# Patient Record
Sex: Male | Born: 1990 | Race: Black or African American | Hispanic: No | Marital: Single | Smoking: Never smoker
Health system: Southern US, Community
[De-identification: ages and names within clinical notes are randomized; demographics above are authoritative.]

## PROBLEM LIST (undated history)

## (undated) DIAGNOSIS — F845 Asperger's syndrome: Secondary | ICD-10-CM

---

## 2019-07-18 ENCOUNTER — Encounter (HOSPITAL_COMMUNITY): Payer: Self-pay | Admitting: Emergency Medicine

## 2019-07-18 ENCOUNTER — Emergency Department (HOSPITAL_COMMUNITY): Payer: Self-pay

## 2019-07-18 ENCOUNTER — Emergency Department (HOSPITAL_COMMUNITY)
Admission: EM | Admit: 2019-07-18 | Discharge: 2019-07-18 | Disposition: A | Discharge status: Home / Self Care | Payer: No Typology Code available for payment source | Attending: Emergency Medicine | Admitting: Emergency Medicine

## 2019-07-18 ENCOUNTER — Other Ambulatory Visit: Payer: Self-pay

## 2019-07-18 DIAGNOSIS — M79671 Pain in right foot: Secondary | ICD-10-CM | POA: Insufficient documentation

## 2019-07-18 DIAGNOSIS — Y990 Civilian activity done for income or pay: Secondary | ICD-10-CM | POA: Insufficient documentation

## 2019-07-18 DIAGNOSIS — Y9389 Activity, other specified: Secondary | ICD-10-CM | POA: Diagnosis not present

## 2019-07-18 DIAGNOSIS — Y92481 Parking lot as the place of occurrence of the external cause: Secondary | ICD-10-CM | POA: Insufficient documentation
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
## 2019-07-18 NOTE — Discharge Instructions (Addendum)

## 2019-07-18 NOTE — ED Notes (Signed)
An After Visit Summary was printed and given to the patient. Discharge instructions given and no further questions at this time.  

## 2019-07-18 NOTE — Progress Notes (Signed)
Orthopedic Tech Progress Note Patient Details:  Juan Hendrix 19-May-1991 [id number]  Ortho Devices Type of Ortho Device: Ace wrap, Crutches Ortho Device/Splint Location: RLE Ortho Device/Splint Interventions: Ordered, Application, Adjustment   Post Interventions Patient Tolerated: Well Instructions Provided: Care of device, Adjustment of device, Poper ambulation with device   Ancil Linsey 07/18/2019, 8:26 PM
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
## 2020-09-13 ENCOUNTER — Observation Stay (HOSPITAL_COMMUNITY): Payer: Self-pay

## 2020-09-13 ENCOUNTER — Encounter (HOSPITAL_COMMUNITY): Payer: Self-pay | Admitting: Emergency Medicine

## 2020-09-13 ENCOUNTER — Other Ambulatory Visit: Payer: Self-pay

## 2020-09-13 ENCOUNTER — Emergency Department (HOSPITAL_COMMUNITY): Payer: Self-pay

## 2020-09-13 ENCOUNTER — Observation Stay (HOSPITAL_COMMUNITY)
Admission: EM | Admit: 2020-09-13 | Discharge: 2020-09-14 | Disposition: A | Discharge status: Home / Self Care | Payer: Self-pay | Attending: Family Medicine | Admitting: Family Medicine

## 2020-09-13 ENCOUNTER — Observation Stay (HOSPITAL_COMMUNITY)
Admit: 2020-09-13 | Discharge: 2020-09-13 | Disposition: A | Discharge status: Home / Self Care | Payer: Self-pay | Attending: Internal Medicine | Admitting: Internal Medicine

## 2020-09-13 DIAGNOSIS — R778 Other specified abnormalities of plasma proteins: Secondary | ICD-10-CM

## 2020-09-13 DIAGNOSIS — R55 Syncope and collapse: Principal | ICD-10-CM | POA: Diagnosis present

## 2020-09-13 DIAGNOSIS — I77819 Aortic ectasia, unspecified site: Secondary | ICD-10-CM | POA: Insufficient documentation

## 2020-09-13 DIAGNOSIS — D509 Iron deficiency anemia, unspecified: Secondary | ICD-10-CM | POA: Insufficient documentation

## 2020-09-13 DIAGNOSIS — I160 Hypertensive urgency: Secondary | ICD-10-CM

## 2020-09-13 DIAGNOSIS — R030 Elevated blood-pressure reading, without diagnosis of hypertension: Secondary | ICD-10-CM | POA: Insufficient documentation

## 2020-09-13 DIAGNOSIS — Z20822 Contact with and (suspected) exposure to covid-19: Secondary | ICD-10-CM | POA: Insufficient documentation

## 2020-09-13 DIAGNOSIS — R9431 Abnormal electrocardiogram [ECG] [EKG]: Secondary | ICD-10-CM

## 2020-09-13 DIAGNOSIS — R7989 Other specified abnormal findings of blood chemistry: Secondary | ICD-10-CM

## 2020-09-13 DIAGNOSIS — R7401 Elevation of levels of liver transaminase levels: Secondary | ICD-10-CM | POA: Insufficient documentation

## 2020-09-13 HISTORY — DX: Asperger's syndrome: F84.5

## 2020-09-13 LAB — HEPATITIS PANEL, ACUTE
HCV Ab: NONREACTIVE
Hep A IgM: NONREACTIVE
Hep B C IgM: NONREACTIVE
Hepatitis B Surface Ag: NONREACTIVE

## 2020-09-13 LAB — CBC
HCT: 45.5 % (ref 39.0–52.0)
Hemoglobin: 14.6 g/dL (ref 13.0–17.0)
MCH: 25.2 pg — ABNORMAL LOW (ref 26.0–34.0)
MCHC: 32.1 g/dL (ref 30.0–36.0)
MCV: 78.6 fL — ABNORMAL LOW (ref 80.0–100.0)
Platelets: 188 10*3/uL (ref 150–400)
RBC: 5.79 MIL/uL (ref 4.22–5.81)
RDW: 14.3 % (ref 11.5–15.5)
WBC: 7.4 10*3/uL (ref 4.0–10.5)
nRBC: 0 % (ref 0.0–0.2)

## 2020-09-13 LAB — RESP PANEL BY RT-PCR (FLU A&B, COVID) ARPGX2
Influenza A by PCR: NEGATIVE
Influenza B by PCR: NEGATIVE
SARS Coronavirus 2 by RT PCR: NEGATIVE

## 2020-09-13 LAB — URINALYSIS, ROUTINE W REFLEX MICROSCOPIC
Bacteria, UA: NONE SEEN
Bilirubin Urine: NEGATIVE
Glucose, UA: NEGATIVE mg/dL
Ketones, ur: NEGATIVE mg/dL
Leukocytes,Ua: NEGATIVE
Nitrite: NEGATIVE
Protein, ur: 30 mg/dL — AB
Specific Gravity, Urine: 1.008 (ref 1.005–1.030)
pH: 8 (ref 5.0–8.0)

## 2020-09-13 LAB — COMPREHENSIVE METABOLIC PANEL
ALT: 70 U/L — ABNORMAL HIGH (ref 0–44)
AST: 60 U/L — ABNORMAL HIGH (ref 15–41)
Albumin: 4.3 g/dL (ref 3.5–5.0)
Alkaline Phosphatase: 56 U/L (ref 38–126)
Anion gap: 7 (ref 5–15)
BUN: 16 mg/dL (ref 6–20)
CO2: 28 mmol/L (ref 22–32)
Calcium: 9 mg/dL (ref 8.9–10.3)
Chloride: 104 mmol/L (ref 98–111)
Creatinine, Ser: 0.99 mg/dL (ref 0.61–1.24)
GFR, Estimated: >60 mL/min (ref >60)
Glucose, Bld: 113 mg/dL — ABNORMAL HIGH (ref 70–99)
Potassium: 3.6 mmol/L (ref 3.5–5.1)
Sodium: 139 mmol/L (ref 135–145)
Total Bilirubin: 0.6 mg/dL (ref 0.3–1.2)
Total Protein: 8.2 g/dL — ABNORMAL HIGH (ref 6.5–8.1)

## 2020-09-13 LAB — RAPID URINE DRUG SCREEN, HOSP PERFORMED
Amphetamines: NOT DETECTED
Barbiturates: NOT DETECTED
Benzodiazepines: NOT DETECTED
Cocaine: NOT DETECTED
Opiates: NOT DETECTED
Tetrahydrocannabinol: NOT DETECTED

## 2020-09-13 LAB — CBC WITH DIFFERENTIAL/PLATELET
Abs Immature Granulocytes: 0.02 10*3/uL (ref 0.00–0.07)
Basophils Absolute: 0 10*3/uL (ref 0.0–0.1)
Basophils Relative: 1 %
Eosinophils Absolute: 0.1 10*3/uL (ref 0.0–0.5)
Eosinophils Relative: 1 %
HCT: 48.3 % (ref 39.0–52.0)
Hemoglobin: 15.1 g/dL (ref 13.0–17.0)
Immature Granulocytes: 0 %
Lymphocytes Relative: 17 %
Lymphs Abs: 1.4 10*3/uL (ref 0.7–4.0)
MCH: 24.9 pg — ABNORMAL LOW (ref 26.0–34.0)
MCHC: 31.3 g/dL (ref 30.0–36.0)
MCV: 79.7 fL — ABNORMAL LOW (ref 80.0–100.0)
Monocytes Absolute: 0.6 10*3/uL (ref 0.1–1.0)
Monocytes Relative: 8 %
Neutro Abs: 6 10*3/uL (ref 1.7–7.7)
Neutrophils Relative %: 73 %
Platelets: 190 10*3/uL (ref 150–400)
RBC: 6.06 MIL/uL — ABNORMAL HIGH (ref 4.22–5.81)
RDW: 14.6 % (ref 11.5–15.5)
WBC: 8.1 10*3/uL (ref 4.0–10.5)
nRBC: 0 % (ref 0.0–0.2)

## 2020-09-13 LAB — IRON AND TIBC
Iron: 43 ug/dL — ABNORMAL LOW (ref 45–182)
Saturation Ratios: 12 % — ABNORMAL LOW (ref 17.9–39.5)
TIBC: 357 ug/dL (ref 250–450)
UIBC: 314 ug/dL

## 2020-09-13 LAB — CREATININE, SERUM
Creatinine, Ser: 0.94 mg/dL (ref 0.61–1.24)
GFR, Estimated: >60 mL/min (ref >60)

## 2020-09-13 LAB — TROPONIN I (HIGH SENSITIVITY)
Troponin I (High Sensitivity): 169 ng/L (ref <18)
Troponin I (High Sensitivity): 167 ng/L (ref <18)
Troponin I (High Sensitivity): 92 ng/L — ABNORMAL HIGH (ref <18)
Troponin I (High Sensitivity): 32 ng/L — ABNORMAL HIGH (ref <18)

## 2020-09-13 LAB — HIV ANTIBODY (ROUTINE TESTING W REFLEX): HIV Screen 4th Generation wRfx: NONREACTIVE

## 2020-09-13 LAB — ETHANOL: Alcohol, Ethyl (B): <10 mg/dL (ref <10)

## 2020-09-13 LAB — TSH: TSH: 3.541 u[IU]/mL (ref 0.350–4.500)

## 2020-09-13 MED ORDER — ENOXAPARIN SODIUM 40 MG/0.4ML ~~LOC~~ SOLN: 40.0000 mg | SUBCUTANEOUS | Status: DC

## 2020-09-13 MED ORDER — ENOXAPARIN SODIUM 60 MG/0.6ML ~~LOC~~ SOLN
60.0000 mg | SUBCUTANEOUS | Status: DC
  Administered 2020-09-13: 60 mg via SUBCUTANEOUS
  Filled 2020-09-13: qty 0.6

## 2020-09-13 MED ORDER — ACETAMINOPHEN 325 MG PO TABS
650.0000 mg | ORAL_TABLET | Freq: Once | ORAL | Status: AC
  Administered 2020-09-13: 650 mg via ORAL
  Filled 2020-09-13: qty 2

## 2020-09-13 MED ORDER — IOHEXOL 350 MG/ML SOLN
100.0000 mL | Freq: Once | INTRAVENOUS | Status: AC | PRN
  Administered 2020-09-13: 100 mL via INTRAVENOUS

## 2020-09-13 MED ORDER — HYDRALAZINE HCL 20 MG/ML IJ SOLN
5.0000 mg | Freq: Four times a day (QID) | INTRAMUSCULAR | Status: DC | PRN
  Administered 2020-09-13: 5 mg via INTRAVENOUS
  Filled 2020-09-13: qty 1

## 2020-09-13 MED ORDER — SODIUM CHLORIDE 0.9% FLUSH
3.0000 mL | Freq: Two times a day (BID) | INTRAVENOUS | Status: DC
  Administered 2020-09-13 – 2020-09-14 (×3): 3 mL via INTRAVENOUS

## 2020-09-13 MED ORDER — ONDANSETRON HCL 4 MG PO TABS: 4.0000 mg | ORAL_TABLET | Freq: Four times a day (QID) | ORAL | Status: DC | PRN

## 2020-09-13 MED ORDER — AMLODIPINE BESYLATE 5 MG PO TABS
5.0000 mg | ORAL_TABLET | Freq: Every day | ORAL | Status: DC
  Administered 2020-09-14: 5 mg via ORAL
  Filled 2020-09-13: qty 1

## 2020-09-13 MED ORDER — AMLODIPINE BESYLATE 5 MG PO TABS
10.0000 mg | ORAL_TABLET | Freq: Once | ORAL | Status: AC
  Administered 2020-09-13: 10 mg via ORAL
  Filled 2020-09-13: qty 2

## 2020-09-13 MED ORDER — ONDANSETRON HCL 4 MG/2ML IJ SOLN: 4.0000 mg | Freq: Four times a day (QID) | INTRAMUSCULAR | Status: DC | PRN

## 2020-09-13 MED ORDER — ACETAMINOPHEN 325 MG PO TABS
650.0000 mg | ORAL_TABLET | Freq: Four times a day (QID) | ORAL | Status: DC | PRN
  Administered 2020-09-13: 650 mg via ORAL
  Filled 2020-09-13: qty 2

## 2020-09-13 MED ORDER — LORAZEPAM 2 MG/ML IJ SOLN
0.5000 mg | INTRAMUSCULAR | Status: AC
  Administered 2020-09-13: 0.5 mg via INTRAVENOUS
  Filled 2020-09-13: qty 1

## 2020-09-13 NOTE — ED Provider Notes (Addendum)
[facility]-EMERGENCY DEPT Provider Note   CSN: [id number] Arrival date & time: 09/13/20  0302     History Chief Complaint  Patient presents with  . Loss of Consciousness    Juan Hendrix is a 30 y.o. male presenting for evaluation of syncope.  Patient states he was staying up late working on something.  He was getting ready for bed, and the next thing he knows he woke up in the bathroom.  He does not know how he got there what happened.  He reports a mild headache, but mostly states he is feeling anxious.  He denies current dizziness, chest pain, shortness of breath, neck pain, back pain, numbness, tingling.  He denies recent fevers, chills, cough, nausea, vomiting, abdominal pain, urinary symptoms, abnormal bowel movements.  He states he has no medical problems, takes no medications daily.  No history of similar events.  He denies sick contacts.  He denies tobacco, alcohol, or drug use.  Additional history obtained from patient's mom.  She woke up when she heard a thud in the bathroom.  A few minutes later, patient called out.  When she went to the bathroom, patient was laying on the floor, appeared very anxious and shaky.  He was little bit confused, but this gradually improved.  He did have bladder incontinence.  HPI     Past Medical History:  Diagnosis Date  . Asperger syndrome     There are no problems to display for this patient.   History reviewed. No pertinent surgical history.     History reviewed. No pertinent family history.  Social History   Tobacco Use  . Smoking status: Never Smoker  . Smokeless tobacco: Never Used  Vaping Use  . Vaping Use: Never used  Substance Use Topics  . Alcohol use: Never  . Drug use: Never    Home Medications Prior to Admission medications   Not on File    Allergies    Patient has no known allergies.  Review of Systems   Review of Systems  Neurological: Positive for headaches.   Psychiatric/Behavioral: Positive for confusion (resolved).  All other systems reviewed and are negative.   Physical Exam Updated Vital Signs BP 128/83   Pulse 72   Temp 98.4 F (36.9 C) (Oral)   Resp 18   Ht 6\' 7"  (2.007 m)   Wt 131.5 kg   SpO2 100%   BMI 32.67 kg/m   Physical Exam Vitals and nursing note reviewed.  Constitutional:      General: He is not in acute distress.    Appearance: He is well-developed.     Comments: Resting in the bed in NAD  HENT:     Head: Normocephalic.      Comments: Hematoma to posterior head Eyes:     Extraocular Movements: Extraocular movements intact.     Conjunctiva/sclera: Conjunctivae normal.     Pupils: Pupils are equal, round, and reactive to light.     Comments: EOMI and PERRLA  Neck:     Comments: No ttp of midline c-spine Cardiovascular:     Rate and Rhythm: Normal rate and regular rhythm.     Pulses: Normal pulses.  Pulmonary:     Effort: Pulmonary effort is normal. No respiratory distress.     Breath sounds: Normal breath sounds. No wheezing.  Abdominal:     General: There is no distension.     Palpations: Abdomen is soft. There is no mass.     Tenderness:  There is no abdominal tenderness. There is no guarding or rebound.  Musculoskeletal:        General: Normal range of motion.     Cervical back: Normal range of motion and neck supple.  Skin:    General: Skin is warm and dry.     Capillary Refill: Capillary refill takes less than 2 seconds.  Neurological:     Mental Status: He is alert and oriented to person, place, and time.     GCS: GCS eye subscore is 4. GCS verbal subscore is 5. GCS motor subscore is 6.     Cranial Nerves: Cranial nerves are intact.     Sensory: Sensation is intact.     Motor: Motor function is intact.     ED Results / Procedures / Treatments   Labs (all labs ordered are listed, but only abnormal results are displayed) Labs Reviewed  CBC WITH DIFFERENTIAL/PLATELET - Abnormal; Notable for  the following components:      Result Value   RBC 6.06 (*)    MCV 79.7 (*)    MCH 24.9 (*)    All other components within normal limits  COMPREHENSIVE METABOLIC PANEL - Abnormal; Notable for the following components:   Glucose, Bld 113 (*)    Total Protein 8.2 (*)    AST 60 (*)    ALT 70 (*)    All other components within normal limits  URINALYSIS, ROUTINE W REFLEX MICROSCOPIC - Abnormal; Notable for the following components:   Color, Urine STRAW (*)    Hgb urine dipstick MODERATE (*)    Protein, ur 30 (*)    All other components within normal limits  TROPONIN I (HIGH SENSITIVITY) - Abnormal; Notable for the following components:   Troponin I (High Sensitivity) 169 (*)    All other components within normal limits  RESP PANEL BY RT-PCR (FLU A&B, COVID) [id number]  RAPID URINE DRUG SCREEN, HOSP PERFORMED  ETHANOL  TROPONIN I (HIGH SENSITIVITY)    EKG EKG Interpretation  Date/Time:  Wednesday September 13 2020 06:47:27 EDT Ventricular Rate:  54 PR Interval:    QRS Duration: 100 QT Interval:  404 QTC Calculation: 383 R Axis:   15 Text Interpretation: Sinus rhythm Probable left ventricular hypertrophy Anterior ST elevation, probably due to LVH No significant change since last tracing Confirmed by Gilda Crease [phone]) on 09/13/2020 6:50:25 AM   Radiology DG Chest 2 View  Result Date: 09/13/2020 CLINICAL DATA:  Syncope EXAM: CHEST - 2 VIEW COMPARISON:  None. FINDINGS: The heart size and mediastinal contours are within normal limits. No focal consolidation. No pulmonary edema. No pleural effusion. No pneumothorax. No acute osseous abnormality. IMPRESSION: No active cardiopulmonary disease. Electronically Signed   By: Tish Frederickson M.D.   On: 09/13/2020 05:53   CT Head Wo Contrast  Result Date: 09/13/2020 CLINICAL DATA:  Syncopal episode, status post fall EXAM: CT HEAD WITHOUT CONTRAST TECHNIQUE: Contiguous axial images were obtained from the base of the skull through the  vertex without intravenous contrast. COMPARISON:  None. FINDINGS: Brain: No evidence of large-territorial acute infarction. No parenchymal hemorrhage. No mass lesion. No extra-axial collection. No mass effect or midline shift. No hydrocephalus. Basilar cisterns are patent. Vascular: No hyperdense vessel. Skull: No acute fracture or focal lesion. Sinuses/Orbits: Paranasal sinuses and mastoid air cells are clear. The orbits are unremarkable. Other: Trace subcutaneus soft tissue edema of the right posterior scalp. IMPRESSION: No acute intracranial abnormality. Electronically Signed   By: Normajean Glasgow.D.  On: 09/13/2020 05:26    Procedures Procedures   Medications Ordered in ED Medications  acetaminophen (TYLENOL) tablet 650 mg (650 mg Oral Given 09/13/20 5462)    ED Course  I have reviewed the triage vital signs and the nursing notes.  Pertinent labs & imaging results that were available during my care of the patient were reviewed by me and considered in my medical decision making (see chart for details).    MDM Rules/Calculators/A&P                          Pt presenting for evaluation of syncope vs sz. On exam, pt appears nontoxic. No neuro deficits. As pt had bladder incontinence, consider sz, but no visualized sz-like activity. No h/o sz. Also consider anemia causing syncope. Consider arrhythmia, electrolyte abnormality. Consider drugs/etoh. Will obtain labs, ct head, ua, ekg.   EKG with nonspecific st changes, no previous to compare.  Patient's troponin elevated at 169.  He is not having any chest pain.  No previous history of syncope, no family history of syncope at a young age.  He will need repeat troponin and likely admission for cardiac work-up in the setting of unknown cause of syncope with an elevated troponin. Labs interpreted by me, otherwise reassuring. No anemia. Electrolytes stable. kidney and liver fnx stable.     Discussed findings with patient and mom.  Cardiology  consult placed.  Repeat EKG unchanged. Delta trop pending.   Discussed with Dr. Herbie Baltimore from cardiology who recommends medicine admit, they will consult.  Patient can stay at [facility].  Final Clinical Impression(s) / ED Diagnoses Final diagnoses:  Elevated troponin  Syncope and collapse    Rx / DC Orders ED Discharge Orders    None       Alveria Apley, PA-C 09/13/20 7035    Gilda Crease, MD 09/13/20 0658    Alveria Apley, PA-C 09/13/20 0093    Gilda Crease, MD 09/13/20 [phone]

## 2020-09-13 NOTE — ED Notes (Signed)
S. Caccavale notified of troponin 169.
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
## 2020-09-13 NOTE — Procedures (Signed)
Patient Name: Juan Hendrix  MRN: [id number]  Epilepsy Attending: Charlsie Quest  Referring Physician/Provider: Dr. Margie Ege Date: 09/13/2020 Duration: 24.47 mins  Patient history: 30 year old male with an episode of syncope.  EEG to evaluate for seizures.  Level of alertness: Awake, asleep  AEDs during EEG study: None  Technical aspects: This EEG study was done with scalp electrodes positioned according to the 10-20 International system of electrode placement. Electrical activity was acquired at a sampling rate of 500Hz  and reviewed with a high frequency filter of 70Hz  and a low frequency filter of 1Hz . EEG data were recorded continuously and digitally stored.   Description: The posterior dominant rhythm consists of 9 Hz activity of moderate voltage (25-35 uV) seen predominantly in posterior head regions, symmetric and reactive to eye opening and eye closing. Sleep was characterized by vertex waves, sleep spindles (12 to 14 Hz), maximal frontocentral region. Hyperventilation did not show any EEG change.  Physiologic photic driving was not seen during photic stimulation.    IMPRESSION: This study is within normal limits. No seizures or epileptiform discharges were seen throughout the recording.  Juan Hendrix 

## 2020-09-13 NOTE — Progress Notes (Signed)
EEG Completed; Results Pending  

## 2020-09-13 NOTE — ED Notes (Signed)
   09/13/20 0814  Vitals  BP (!) 151/107  MAP (mmHg) 120  Pulse Rate (!) 55  ECG Heart Rate (!) 56  Resp 11  MEWS COLOR  MEWS Score Color Green  Oxygen Therapy  SpO2 100 %  MEWS Score  MEWS Temp 0  MEWS Systolic 0  MEWS Pulse 0  MEWS RR 1  MEWS LOC 0  MEWS Score 1  Dr, Ronaldo Miyamoto made aware of patient's current BP.  Received new orders.

## 2020-09-13 NOTE — Progress Notes (Signed)
Dr. Nadene Rubins paged to notify:This was a true arterial study, so not the best view for PE - but still able to say no central or lobar PE visualized.  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

## 2020-09-13 NOTE — ED Notes (Signed)
EEG complete. [place] at bedside.

## 2020-09-13 NOTE — Consult Note (Signed)
Cardiology Consultation:   Patient ID: Juan Hendrix MRN: [id number]; DOB: 07-16-1990  Admit date: 09/13/2020 Date of Consult: 09/13/2020  PCP:  [facility]  Cardiologist: New to Dr. Mayford Knife    Patient Profile:   Juan Hendrix is a 30 y.o. male with a hx of Asperger syndrome who is being seen today for the evaluation of syncope and elevated troponin at the request of Dr. Ronaldo Miyamoto.  History of Present Illness:   Mr. Hurn was in usual state of health up until this morning around 2 AM when he had syncope episode.  Patient was awake and working on YouTube video and next thing he remembers waking up on bathroom floor.  His mother's bedroom is next to his son's bathroom.  She heard noise but did not pay any attention.  5 minutes later she heard his son calling "mom".  Mother found the patient on the floor.  Then noted " shaking body".  Mother reported patient has intermittent "shaking body" while he is very anxious.  Patient did not complain any immediate chest pain, shortness of breath, palpitation, slurred speech or weakness on one side of body.  Had dizziness. Blood sugar was 125.  Blood pressure 150s/110s HR 60s.  Mom gave him a bath in shower tub and then noted from on back of his head.  Despite spending about 45 minutes in bathroom patient was weak and unable to get up  and brought to ER for further evaluation.  Patient denies palpitation, orthopnea, PND, lower extremity edema, abdominal pain, fever, chills, cough, congestion, melena or blood in his stool or urine.  High-sensitivity troponin 169>>167. Potassium 3.6 Creatinine normal Minimally elevated AST/ALT Hemoglobin 15.1 Respiratory panel negative for influenza and Covid Protein noted in urine Urine drug screen clear Alcohol level normal CT of head without acute intracranial abnormality Chest x-ray without acute finding Pending EEG, MRI of brain and echocardiogram  At baseline patient is  functional.  He does not drive.  He goes to work.  Does household chores without any issue.  No family history of CAD or premature death.  Mom reported elevated blood pressure during stress situation.  Past Medical History:  Diagnosis Date  . Asperger syndrome     Inpatient Medications: Scheduled Meds:  Continuous Infusions:  PRN Meds: Allergies:   No Known Allergies  Social History:   Social History   Socioeconomic History  . Marital status: Single    Spouse name: Not on file  . Number of children: Not on file  . Years of education: Not on file  . Highest education level: Not on file  Occupational History  . Not on file  Tobacco Use  . Smoking status: Never Smoker  . Smokeless tobacco: Never Used  Vaping Use  . Vaping Use: Never used  Substance and Sexual Activity  . Alcohol use: Never  . Drug use: Never  . Sexual activity: Not on file  Other Topics Concern  . Not on file  Social History Narrative  . Not on file   Social Determinants of Health   Financial Resource Strain: Not on file  Food Insecurity: Not on file  Transportation Needs: Not on file  Physical Activity: Not on file  Stress: Not on file  Social Connections: Not on file  Intimate Partner Violence: Not on file    Family History:    Family History  Problem Relation Age of Onset  . Stroke Maternal Grandmother  ROS:  Please see the history of present illness.  All other ROS reviewed and negative.     Physical Exam/Data:   Vitals:   09/13/20 0310 09/13/20 0600 09/13/20 0722 09/13/20 0814  BP: (!) 158/109 128/83 (!) 156/118 (!) 151/107  Pulse: 60 72 75 (!) 55  Resp: 19 18 14 11   Temp: 98.4 F (36.9 C)  (!) 97.4 F (36.3 C)   TempSrc: Oral  Oral   SpO2: 97% 100% 100% 100%  Weight: 131.5 kg     Height: 6\' 7"  (2.007 m)      No intake or output data in the 24 hours ending 09/13/20 0848 Last 3 Weights 09/13/2020  Weight (lbs) 290 lb  Weight (kg) 131.543 kg     Body mass index is  32.67 kg/m.  General:  Well nourished, well developed, in no acute distress HEENT: normal Lymph: no adenopathy Neck: no JVD Endocrine:  No thryomegaly Vascular: No carotid bruits; FA pulses 2+ bilaterally without bruits  Cardiac:  normal S1, S2; RRR; no murmur  Lungs:  clear to auscultation bilaterally, no wheezing, rhonchi or rales  Abd: soft, nontender, no hepatomegaly  Ext: no edema Musculoskeletal:  No deformities, BUE and BLE strength normal and equal Skin: warm and dry  Neuro:  CNs 2-12 intact, no focal abnormalities noted Psych:  Normal affect   EKG:  The EKG was personally reviewed and demonstrates:  Sinus bradycardia 54, non specific ST changes, LVH Telemetry:  Telemetry was personally reviewed and demonstrates:  SR at 60s, intermittent bradycardia in mid 40s  Relevant CV Studies:  Pending echo   Laboratory Data:  High Sensitivity Troponin:   Recent Labs  Lab 09/13/20 0436 09/13/20 0630  TROPONINIHS 169* 167*     Chemistry Recent Labs  Lab 09/13/20 0436  NA 139  K 3.6  CL 104  CO2 28  GLUCOSE 113*  BUN 16  CREATININE 0.99  CALCIUM 9.0  GFRNONAA >60  ANIONGAP 7    Recent Labs  Lab 09/13/20 0436  PROT 8.2*  ALBUMIN 4.3  AST 60*  ALT 70*  ALKPHOS 56  BILITOT 0.6   Hematology Recent Labs  Lab 09/13/20 0436  WBC 8.1  RBC 6.06*  HGB 15.1  HCT 48.3  MCV 79.7*  MCH 24.9*  MCHC 31.3  RDW 14.6  PLT 190   Radiology/Studies:  DG Chest 2 View  Result Date: 09/13/2020 CLINICAL DATA:  Syncope EXAM: CHEST - 2 VIEW COMPARISON:  None. FINDINGS: The heart size and mediastinal contours are within normal limits. No focal consolidation. No pulmonary edema. No pleural effusion. No pneumothorax. No acute osseous abnormality. IMPRESSION: No active cardiopulmonary disease. Electronically Signed   By: 09/15/20 M.D.   On: 09/13/2020 05:53   CT Head Wo Contrast  Result Date: 09/13/2020 CLINICAL DATA:  Syncopal episode, status post fall EXAM: CT HEAD  WITHOUT CONTRAST TECHNIQUE: Contiguous axial images were obtained from the base of the skull through the vertex without intravenous contrast. COMPARISON:  None. FINDINGS: Brain: No evidence of large-territorial acute infarction. No parenchymal hemorrhage. No mass lesion. No extra-axial collection. No mass effect or midline shift. No hydrocephalus. Basilar cisterns are patent. Vascular: No hyperdense vessel. Skull: No acute fracture or focal lesion. Sinuses/Orbits: Paranasal sinuses and mastoid air cells are clear. The orbits are unremarkable. Other: Trace subcutaneus soft tissue edema of the right posterior scalp. IMPRESSION: No acute intracranial abnormality. Electronically Signed   By: 09/15/2020 M.D.   On: 09/13/2020 05:26  Assessment and Plan:   1. Syncope - Unknown etiology.  Differential includes seizure activity or arrhythmia.  Troponin minimally elevated with nonspecific ST changes on EKG.  Does not suspect syncope due to ACS. - Pending echocardiogram, EEG and MR of brain. - Likely monitor at discharge.  -No driving for 6 months, fortunately patient does not drive -Check TSH and Orthostatics   2.  Elevated troponin -Not an ACS pattern -Troponin flat 169>>167 -No chest pain or shortness of breath -EKG with LVH and repolarization abnormality.  Nonspecific ST changes -Await echocardiogram result  3.  Elevated blood pressure without diagnosis of hypertension - Mom reported prior elevated blood pressure in setting of acute stress situation. -EKG with LVH -Pending echo  4.  Transaminitis -Per primary team  Dr. Mayford Knife to see.   Risk Assessment/Risk Scores:   For questions or updates, please contact [facility] Please consult [email or web address] for contact info under    Lorelei Pont, PA  09/13/2020 8:48 AM

## 2020-09-13 NOTE — ED Notes (Signed)
This RN in to draw labs. Pt requesting to use bathroom first. Ambulatory to bathroom without issue.

## 2020-09-13 NOTE — ED Provider Notes (Signed)
[id number] spoke with hospitalist Dr. Ronaldo Miyamoto who agreed to see the patient for admission.   Haskel Schroeder, PA-C 09/13/20 0754    Jacalyn Lefevre, MD 09/14/20 [phone]

## 2020-09-13 NOTE — ED Notes (Signed)
Pt back to room at this time

## 2020-09-13 NOTE — ED Notes (Signed)
EEG at bedside.

## 2020-09-13 NOTE — H&P (Addendum)
History and Physical    Juan Hendrix WLS:[id number] DOB: 1991/05/17 DOA: 09/13/2020  PCP: Pcp, No  Patient coming from: Home  Chief Complaint: passing out  HPI: Juan Hendrix is a 30 y.o. male with medical history significant of Asperger's disease. He reports that he was in his normal state of health yesterday. He was working on an Financial trader late into the night. This was the last thing his remember before waking up on the bathroom floor around 2 this morning. Per his mother, she heard a loud noise in the bathroom around 2 am. She called to him, but he did not answer. She found him on the bathroom floor. He was panicking, shaking, and saying nonsensical words. It appeared he had hit his head. She took his sugar. It was about 125. She took his BP. It was 153/112. She tried get him up, but initially, he could not. She gave him some water, and he was able to move to the tub. After about 45 minutes, he was able to get to and move to his bedroom. His mother then brought him to the ED. She denies any other aggravating or alleviating factors.    ED Course: CTH was negative. His BP was elevated. Drug and EtOH screens were negative. His trp was elevated. Cardiology was consulted. TRH was called for admission.   Review of Systems:  Denies CP, palpitations, dyspnea, N/V/D/F. Review of systems is otherwise negative for all not mentioned in HPI.   PMHx Past Medical History:  Diagnosis Date  . Asperger syndrome     PSHx History reviewed. No pertinent surgical history.  SocHx  reports that he has never smoked. He has never used smokeless tobacco. He reports that he does not drink alcohol and does not use drugs.  No Known Allergies  FamHx History reviewed. No pertinent family history.  Prior to Admission medications   Not on File    Physical Exam: Vitals:   09/13/20 0310 09/13/20 0600  BP: (!) 158/109 128/83  Pulse: 60 72  Resp: 19 18  Temp: 98.4 F (36.9 C)   TempSrc: Oral    SpO2: 97% 100%  Weight: 131.5 kg   Height: 6\' 7"  (2.007 m)     General: 30 y.o. male resting in bed in NAD Eyes: PERRL, normal sclera ENMT: Nares patent w/o discharge, orophaynx clear, dentition normal, ears w/o discharge/lesions/ulcers Neck: Supple, trachea midline Cardiovascular: RRR, +S1, S2, no m/g/r, equal pulses throughout Respiratory: CTABL, no w/r/r, normal WOB GI: BS+, NDNT, no masses noted, no organomegaly noted MSK: No e/c/c Skin: No rashes, bruises, ulcerations noted Neuro: A&O x 3, no focal deficits Psyc: Appropriate interaction but flat affect, calm/cooperative  Labs on Admission: I have personally reviewed following labs and imaging studies  CBC: Recent Labs  Lab 09/13/20 0436  WBC 8.1  NEUTROABS 6.0  HGB 15.1  HCT 48.3  MCV 79.7*  PLT 190   Basic Metabolic Panel: Recent Labs  Lab 09/13/20 0436  NA 139  K 3.6  CL 104  CO2 28  GLUCOSE 113*  BUN 16  CREATININE 0.99  CALCIUM 9.0   GFR: Estimated Creatinine Clearance: 169.4 mL/min (by C-G formula based on SCr of 0.99 mg/dL). Liver Function Tests: Recent Labs  Lab 09/13/20 0436  AST 60*  ALT 70*  ALKPHOS 56  BILITOT 0.6  PROT 8.2*  ALBUMIN 4.3   No results for input(s): LIPASE, AMYLASE in the last 168 hours. No results for input(s): AMMONIA in the last 168  hours. Coagulation Profile: No results for input(s): INR, PROTIME in the last 168 hours. Cardiac Enzymes: No results for input(s): CKTOTAL, CKMB, CKMBINDEX, TROPONINI in the last 168 hours. BNP (last 3 results) No results for input(s): PROBNP in the last 8760 hours. HbA1C: No results for input(s): HGBA1C in the last 72 hours. CBG: No results for input(s): GLUCAP in the last 168 hours. Lipid Profile: No results for input(s): CHOL, HDL, LDLCALC, TRIG, CHOLHDL, LDLDIRECT in the last 72 hours. Thyroid Function Tests: No results for input(s): TSH, T4TOTAL, FREET4, T3FREE, THYROIDAB in the last 72 hours. Anemia Panel: No results for  input(s): VITAMINB12, FOLATE, FERRITIN, TIBC, IRON, RETICCTPCT in the last 72 hours. Urine analysis:    Component Value Date/Time   COLORURINE STRAW (A) 09/13/2020 0437   APPEARANCEUR CLEAR 09/13/2020 0437   LABSPEC 1.008 09/13/2020 0437   PHURINE 8.0 09/13/2020 0437   GLUCOSEU NEGATIVE 09/13/2020 0437   HGBUR MODERATE (A) 09/13/2020 0437   BILIRUBINUR NEGATIVE 09/13/2020 0437   KETONESUR NEGATIVE 09/13/2020 0437   PROTEINUR 30 (A) 09/13/2020 0437   NITRITE NEGATIVE 09/13/2020 0437   LEUKOCYTESUR NEGATIVE 09/13/2020 0437    Radiological Exams on Admission: DG Chest 2 View  Result Date: 09/13/2020 CLINICAL DATA:  Syncope EXAM: CHEST - 2 VIEW COMPARISON:  None. FINDINGS: The heart size and mediastinal contours are within normal limits. No focal consolidation. No pulmonary edema. No pleural effusion. No pneumothorax. No acute osseous abnormality. IMPRESSION: No active cardiopulmonary disease. Electronically Signed   By: Tish Frederickson M.D.   On: 09/13/2020 05:53   CT Head Wo Contrast  Result Date: 09/13/2020 CLINICAL DATA:  Syncopal episode, status post fall EXAM: CT HEAD WITHOUT CONTRAST TECHNIQUE: Contiguous axial images were obtained from the base of the skull through the vertex without intravenous contrast. COMPARISON:  None. FINDINGS: Brain: No evidence of large-territorial acute infarction. No parenchymal hemorrhage. No mass lesion. No extra-axial collection. No mass effect or midline shift. No hydrocephalus. Basilar cisterns are patent. Vascular: No hyperdense vessel. Skull: No acute fracture or focal lesion. Sinuses/Orbits: Paranasal sinuses and mastoid air cells are clear. The orbits are unremarkable. Other: Trace subcutaneus soft tissue edema of the right posterior scalp. IMPRESSION: No acute intracranial abnormality. Electronically Signed   By: Tish Frederickson M.D.   On: 09/13/2020 05:26    EKG: Independently reviewed. Sinus, has 45mm elevation V1 - V3  Assessment/Plan Syncopal  episode     - admit to obs, tele     - CTH negative, UA negative UDS/EtOH negative     - story has some elements concerning for seizure (no prior history); check MRI brain and EEG; have spoken with neurology, formal consult if changes noted on MRI/EEG     - neuro checks, orthostatics     - EKG shows sinus w/ small ST elevations, see below  Elevated troponins     - 169 -> 167     - no chest pain noted     - EKG as above     - Echo ordered     - cardiology consulted; defer to them  Elevated LFTs      - check RUQ [place], hep panel  Aspberger      - continue outpt follow up  Microcytosis      - check iron levels  HTN     - no previous dx     - amlodipine ordered; follow BP  DVT prophylaxis: lovenox  Code Status: FULL  Family Communication: With mother at bedside  Consults called: Cardiology, Neurology   Status is: Observation  The patient remains OBS appropriate and will d/c before 2 midnights.  Dispo: The patient is from: Home              Anticipated d/c is to: Home              Patient currently is not medically stable to d/c.   Difficult to place patient No  Time spent coordinating admission: 70 minutes  Zavier Canela A Glanda Spanbauer DO [facility]  If 7PM-7AM, please contact night-coverage [email or web address]  09/13/2020, 7:23 AM

## 2020-09-13 NOTE — ED Notes (Signed)
After standing for approximately 1 minute during the standing portion of the orthostatic vital signs patient stated that he felt like he was going to fall over and could not stand up for the remainder of the 3 minute standing portion of the orthostatic vital signs.

## 2020-09-14 ENCOUNTER — Encounter: Payer: Self-pay | Admitting: *Deleted

## 2020-09-14 ENCOUNTER — Observation Stay (HOSPITAL_BASED_OUTPATIENT_CLINIC_OR_DEPARTMENT_OTHER): Payer: Self-pay

## 2020-09-14 ENCOUNTER — Other Ambulatory Visit: Payer: Self-pay | Admitting: *Deleted

## 2020-09-14 ENCOUNTER — Other Ambulatory Visit: Payer: Self-pay | Admitting: Physician Assistant

## 2020-09-14 DIAGNOSIS — R030 Elevated blood-pressure reading, without diagnosis of hypertension: Secondary | ICD-10-CM

## 2020-09-14 DIAGNOSIS — I77819 Aortic ectasia, unspecified site: Secondary | ICD-10-CM

## 2020-09-14 DIAGNOSIS — R55 Syncope and collapse: Secondary | ICD-10-CM

## 2020-09-14 DIAGNOSIS — R079 Chest pain, unspecified: Secondary | ICD-10-CM

## 2020-09-14 LAB — ECHOCARDIOGRAM COMPLETE
Area-P 1/2: 2.95 cm2
Calc EF: 70.9 %
Height: 79 in
S' Lateral: 2.8 cm
Single Plane A2C EF: 73.5 %
Single Plane A4C EF: 65.5 %
Weight: 4751.35 oz

## 2020-09-14 LAB — COMPREHENSIVE METABOLIC PANEL
ALT: 51 U/L — ABNORMAL HIGH (ref 0–44)
AST: 30 U/L (ref 15–41)
Albumin: 3.8 g/dL (ref 3.5–5.0)
Alkaline Phosphatase: 50 U/L (ref 38–126)
Anion gap: 9 (ref 5–15)
BUN: 12 mg/dL (ref 6–20)
CO2: 27 mmol/L (ref 22–32)
Calcium: 9.3 mg/dL (ref 8.9–10.3)
Chloride: 103 mmol/L (ref 98–111)
Creatinine, Ser: 1 mg/dL (ref 0.61–1.24)
GFR, Estimated: >60 mL/min (ref >60)
Glucose, Bld: 105 mg/dL — ABNORMAL HIGH (ref 70–99)
Potassium: 3.4 mmol/L — ABNORMAL LOW (ref 3.5–5.1)
Sodium: 139 mmol/L (ref 135–145)
Total Bilirubin: 1.2 mg/dL (ref 0.3–1.2)
Total Protein: 7.4 g/dL (ref 6.5–8.1)

## 2020-09-14 LAB — CBC
HCT: 47.8 % (ref 39.0–52.0)
Hemoglobin: 15.2 g/dL (ref 13.0–17.0)
MCH: 24.8 pg — ABNORMAL LOW (ref 26.0–34.0)
MCHC: 31.8 g/dL (ref 30.0–36.0)
MCV: 78.1 fL — ABNORMAL LOW (ref 80.0–100.0)
Platelets: 201 10*3/uL (ref 150–400)
RBC: 6.12 MIL/uL — ABNORMAL HIGH (ref 4.22–5.81)
RDW: 14.4 % (ref 11.5–15.5)
WBC: 7.3 10*3/uL (ref 4.0–10.5)
nRBC: 0 % (ref 0.0–0.2)

## 2020-09-14 LAB — GLUCOSE, CAPILLARY: Glucose-Capillary: 115 mg/dL — ABNORMAL HIGH (ref 70–99)

## 2020-09-14 LAB — MAGNESIUM: Magnesium: 2.2 mg/dL (ref 1.7–2.4)

## 2020-09-14 MED ORDER — POTASSIUM CHLORIDE CRYS ER 20 MEQ PO TBCR
40.0000 meq | EXTENDED_RELEASE_TABLET | Freq: Once | ORAL | Status: AC
  Administered 2020-09-14: 40 meq via ORAL
  Filled 2020-09-14: qty 2

## 2020-09-14 MED ORDER — AMLODIPINE BESYLATE 5 MG PO TABS: 5.0000 mg | ORAL_TABLET | Freq: Every day | ORAL | 2 refills | Status: DC

## 2020-09-14 MED ORDER — METOPROLOL TARTRATE 5 MG/5ML IV SOLN
5.0000 mg | Freq: Once | INTRAVENOUS | Status: AC
  Administered 2020-09-14: 5 mg via INTRAVENOUS
  Filled 2020-09-14: qty 5

## 2020-09-14 NOTE — Progress Notes (Signed)
  Echocardiogram 2D Echocardiogram has been performed.  Juan Hendrix 09/14/2020, 2:13 PM

## 2020-09-14 NOTE — Progress Notes (Signed)
Progress Note  Patient Name: Juan Hendrix Date of Encounter: 09/14/2020  Primary Cardiologist: Armanda Magic, MD  Subjective   Feeling better today. Headache improved. No CP or SOB. Awaiting echo.  Inpatient Medications    Scheduled Meds: . amLODipine  5 mg Oral Daily  . enoxaparin (LOVENOX) injection  60 mg Subcutaneous Q24H  . sodium chloride flush  3 mL Intravenous Q12H   Continuous Infusions:  PRN Meds: acetaminophen, hydrALAZINE, ondansetron **OR** ondansetron (ZOFRAN) IV   Vital Signs    Vitals:   09/14/20 0413 09/14/20 0534 09/14/20 0600 09/14/20 1013  BP: (!) 143/92 135/89  140/85  Pulse: 66 62  70  Resp: 20   14  Temp: 97.6 F (36.4 C)   98.2 F (36.8 C)  TempSrc: Oral   Oral  SpO2: 98%   99%  Weight:   134.7 kg   Height:        Intake/Output Summary (Last 24 hours) at 09/14/2020 1148 Last data filed at 09/14/2020 0600 Gross per 24 hour  Intake 240 ml  Output 700 ml  Net -460 ml   Last 3 Weights 09/14/2020 09/13/2020  Weight (lbs) 296 lb 15.4 oz 290 lb  Weight (kg) 134.7 kg 131.543 kg     Telemetry    NSR - Personally Reviewed  Physical Exam   GEN: No acute distress.  HEENT: Normocephalic, atraumatic, sclera non-icteric. Neck: No JVD or bruits. Cardiac: RRR no murmurs, rubs, or gallops.  Respiratory: Clear to auscultation bilaterally. Breathing is unlabored. GI: Soft, nontender, non-distended, BS +x 4. MS: no deformity. Extremities: No clubbing or cyanosis. No edema. Distal pedal pulses are 2+ and equal bilaterally. Neuro:  AAOx3. Follows commands. Psych:  Responds to questions appropriately with a normal affect.  Labs    High Sensitivity Troponin:   Recent Labs  Lab 09/13/20 0436 09/13/20 0630 09/13/20 1824 09/13/20 2047  TROPONINIHS 169* 167* 92* 32*      Cardiac EnzymesNo results for input(s): TROPONINI in the last 168 hours. No results for input(s): TROPIPOC in the last 168 hours.   Chemistry Recent Labs  Lab  09/13/20 0436 09/13/20 1122 09/14/20 0516  NA 139  --  139  K 3.6  --  3.4*  CL 104  --  103  CO2 28  --  27  GLUCOSE 113*  --  105*  BUN 16  --  12  CREATININE 0.99 0.94 1.00  CALCIUM 9.0  --  9.3  PROT 8.2*  --  7.4  ALBUMIN 4.3  --  3.8  AST 60*  --  30  ALT 70*  --  51*  ALKPHOS 56  --  50  BILITOT 0.6  --  1.2  GFRNONAA >60 >60 >60  ANIONGAP 7  --  9     Hematology Recent Labs  Lab 09/13/20 0436 09/13/20 1122 09/14/20 0516  WBC 8.1 7.4 7.3  RBC 6.06* 5.79 6.12*  HGB 15.1 14.6 15.2  HCT 48.3 45.5 47.8  MCV 79.7* 78.6* 78.1*  MCH 24.9* 25.2* 24.8*  MCHC 31.3 32.1 31.8  RDW 14.6 14.3 14.4  PLT 190 188 201    BNPNo results for input(s): BNP, PROBNP in the last 168 hours.   DDimer No results for input(s): DDIMER in the last 168 hours.   Radiology    DG Chest 2 View  Result Date: 09/13/2020 CLINICAL DATA:  Syncope EXAM: CHEST - 2 VIEW COMPARISON:  None. FINDINGS: The heart size and mediastinal contours are within normal  limits. No focal consolidation. No pulmonary edema. No pleural effusion. No pneumothorax. No acute osseous abnormality. IMPRESSION: No active cardiopulmonary disease. Electronically Signed   By: Tish Frederickson M.D.   On: 09/13/2020 05:53   CT Head Wo Contrast  Result Date: 09/13/2020 CLINICAL DATA:  Syncopal episode, status post fall EXAM: CT HEAD WITHOUT CONTRAST TECHNIQUE: Contiguous axial images were obtained from the base of the skull through the vertex without intravenous contrast. COMPARISON:  None. FINDINGS: Brain: No evidence of large-territorial acute infarction. No parenchymal hemorrhage. No mass lesion. No extra-axial collection. No mass effect or midline shift. No hydrocephalus. Basilar cisterns are patent. Vascular: No hyperdense vessel. Skull: No acute fracture or focal lesion. Sinuses/Orbits: Paranasal sinuses and mastoid air cells are clear. The orbits are unremarkable. Other: Trace subcutaneus soft tissue edema of the right posterior  scalp. IMPRESSION: No acute intracranial abnormality. Electronically Signed   By: Tish Frederickson M.D.   On: 09/13/2020 05:26   MR BRAIN WO CONTRAST  Result Date: 09/13/2020 CLINICAL DATA:  Syncope.  Normal neuro exam.  Possible seizure. EXAM: MRI HEAD WITHOUT CONTRAST TECHNIQUE: Multiplanar, multiecho pulse sequences of the brain and surrounding structures were obtained without intravenous contrast. COMPARISON:  CT head 09/13/2020 FINDINGS: Brain: No acute infarction, hemorrhage, hydrocephalus, extra-axial collection or mass lesion. Small hyperintensities in the frontal white matter bilaterally, mild in degree. Remaining white matter normal. Brainstem normal. Vascular: Normal arterial flow voids. Skull and upper cervical spine: No focal skeletal abnormality. Sinuses/Orbits: Negative Other: None IMPRESSION: No acute abnormality. Small hyperintensities in the frontal white matter bilaterally. These are nonspecific and could be seen with chronic ischemia, complex migraine headache, head injury. Not typical location for demyelinating disease. Electronically Signed   By: Marlan Palau M.D.   On: 09/13/2020 13:36   EEG adult  Result Date: 09/13/2020 Juan Quest, MD     09/13/2020 11:39 AM Patient Name: Juan Hendrix MRN: [id number] Epilepsy Attending: Charlsie Hendrix Referring Physician/Provider: Dr. Margie Ege Date: 09/13/2020 Duration: 24.47 mins Patient history: 30 year old male with an episode of syncope.  EEG to evaluate for seizures. Level of alertness: Awake, asleep AEDs during EEG study: None Technical aspects: This EEG study was done with scalp electrodes positioned according to the 10-20 International system of electrode placement. Electrical activity was acquired at a sampling rate of 500Hz  and reviewed with a high frequency filter of 70Hz  and a low frequency filter of 1Hz . EEG data were recorded continuously and digitally stored. Description: The posterior dominant rhythm consists of 9 Hz  activity of moderate voltage (25-35 uV) seen predominantly in posterior head regions, symmetric and reactive to eye opening and eye closing. Sleep was characterized by vertex waves, sleep spindles (12 to 14 Hz), maximal frontocentral region. Hyperventilation did not show any EEG change.  Physiologic photic driving was not seen during photic stimulation.  IMPRESSION: This study is within normal limits. No seizures or epileptiform discharges were seen throughout the recording. Priyanka   CT ANGIO CHEST AORTA W/CM & OR WO/CM  Result Date: 09/13/2020 CLINICAL DATA:  Malignant hypertension, concern for aortic dissection. EXAM: CT ANGIOGRAPHY CHEST WITH CONTRAST TECHNIQUE: Multidetector CT imaging of the chest was performed using the standard protocol during bolus administration of intravenous contrast. Multiplanar CT image reconstructions and MIPs were obtained to evaluate the vascular anatomy. CONTRAST:  OMNIPAQUE IOHEXOL 350 MG/ML SOLN COMPARISON:  Chest x-ray from September 13, 2020 FINDINGS: Cardiovascular: Aortic caliber is 4.0 cm in the axial plane. Heart size is enlarged.  No pericardial effusion. No sign of aortic dissection. Three-vessel branching pattern in the chest. No sign of central or lobar pulmonary embolism. Study performed as arterial evaluation rather than PE protocol Mediastinum/Nodes: Thoracic inlet structures are normal. No axillary lymphadenopathy. No mediastinal adenopathy. No hilar adenopathy. Lungs/Pleura: No pneumothorax. No pleural effusion. No consolidation. Airways are patent. Upper Abdomen: Incidental imaging of upper abdominal contents with signs of hepatic steatosis. No pericholecystic stranding. Visualized pancreas, spleen, adrenal glands and kidneys are unremarkable. Musculoskeletal: No acute musculoskeletal process. No destructive bone finding. Review of the MIP images confirms the above findings. IMPRESSION: 1. No evidence of thoracic aortic aneurysm or dissection.  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 Suggest cardiology and or vascular surgery follow-up. Could consider echocardiography as warranted. 2. Cardiomegaly 3. No sign of central or lobar embolism. Lung base assessment limited by motion artifact. 4. Mild cardiomegaly. 5. Incidental imaging of upper abdominal contents with signs of hepatic steatosis. Electronically Signed   By: Donzetta Kohut M.D.   On: 09/13/2020 20:16   US Abdomen Limited RUQ (LIVER/GB)  Result Date: 09/13/2020 CLINICAL DATA:  Abnormal labs EXAM: ULTRASOUND ABDOMEN LIMITED RIGHT UPPER QUADRANT COMPARISON:  None. FINDINGS: Gallbladder: No gallstones or wall thickening visualized. No sonographic Murphy sign noted by sonographer. Minimal dependent sludge Common bile duct: Diameter: 5-6 mm. Where visualized, no filling defect. Normal biliary labs. Liver: Echogenic liver mild sparing at the gallbladder fossa. No focal masslike finding. Portal vein is patent on color Doppler imaging with normal direction of blood flow towards the liver. Other: None. IMPRESSION: Hepatic steatosis. Mild gallbladder sludge. Electronically Signed   By: Marnee Spring M.D.   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  Assessment & Plan    1. Syncope - unknown etiology, differential includes seizure activity or arrhythmia - troponin minimally elevated with nonspecific ST changes on EKG - hsTroponin relatively low and flat, less suggestive of ACS - EEG normal, MRI brain nonacute, noting "Small hyperintensities in the frontal white matter bilaterally. These are nonspecific and could be seen with chronic ischemia, complex migraine headache, head injury. Not typical location for demyelinating  disease" - orthostatics yesterday AM were negative by VS (hypertensive at the time as well) but he did feel like he was going to fall over while standing - still pending echocardiogram - anticipate plan for 30 day outpatient monitor at DC  - recommend no driving for 6 months, fortunately patient does not drive  2.  Elevated troponin - Not an ACS pattern , troponin flat 169>>167>92>32 - No chest pain or shortness of breath - EKG with LVH and repolarization abnormality, nonspecific ST changes - Await echocardiogram result  3.  Elevated blood pressure without diagnosis of hypertension - Mom reported prior elevated blood pressure in setting of acute stress situation. - EKG with LVH - amlodipine started - would otherwise probably not be overaggressive with BP control until full workup for syncope done - Pending echo  4.  Transaminitis - hepatic steatosis noted on CT - per primary team  5. Dilation of ascending aorta up to 4cm compatible with aortic aneurysm - will discuss finding with MD - await echo to eval for valvular pathology - continue amlodipine for BP control -  can consider BB in outpatient setting but anticipate awaiting a monitor first  6. Hypokalemia - lyte management per primary team - add on Mg level, recommend to keep 2.0 or greater  For questions or updates, please contact [facility] Please consult [email or web address] for contact info under Cardiology/STEMI.  Signed, Laurann Montanaayna N Hanifah Royse, PA-C 09/14/2020, 11:48 AM

## 2020-09-14 NOTE — Progress Notes (Signed)
2D echo reviewed and showed normal LVF with mild LVH.  Suspect that he has undiagnosed HTN.  Will get sleep study and 48 hour BP monitor as well as 30 day event monitor and followup in clinic.  Will sign off.  Call with any questions.

## 2020-09-14 NOTE — Progress Notes (Signed)
Per Dr. Norris Cross request, sent message to office to help arrange 48hr BP monitor, Itamar sleep study, and 30 day live monitor. I did place order for the monitor but requested nurse assistance to ensure the other 2 are ordered correctly per outpatient office protocol. Tried call into patient room to relay findings but no answer. Relayed update to IM and nurse by secure chat. Kinzlee Selvy PA-C

## 2020-09-14 NOTE — Progress Notes (Signed)
Patient ID: Juan Hendrix, male   DOB: [birth year], 30 y.o.   MRN: [id number] Patient enrolled for [employer] to ship a 30 day cardiac event monitor to his home. Letter with instructions and information regarding [employer] discounted self pay rate mailed to patient.

## 2020-09-14 NOTE — Discharge Summary (Signed)
Physician Discharge Summary  Juan Hendrix ZOX:[id number] DOB: 29-Apr-1991 DOA: 09/13/2020  PCP: Oneita Hurt, No  Admit date: 09/13/2020 Discharge date: 09/14/2020  Time spent: 50* minutes  Recommendations for Outpatient Follow-up:  1. Follow-up cardiology in 2 months 2. Cardiology office will call to arrange for 30-day heart monitor to ER, sleep study to evaluate for sleep apnea and 48-hour blood pressure monitor 3. Check serum iron level in 3 months   Discharge Diagnoses:  Active Problems:   Syncope   Discharge Condition: Stable  Diet recommendation: Heart healthy diet  Filed Weights   09/13/20 0310 09/14/20 0600  Weight: 131.5 kg 134.7 kg    History of present illness:  30 year old male with medical history of Asperger's disease was brought to the hospital after patient passed out at home waking up in the bathroom floor around 2 AM.  As per patient's mother she heard a loud noise in the bathroom around 2 AM and found him on the bathroom floor.  He was panicking, shaking saying nonsensical words.  Was brought to the ED.  CT head was negative.  Blood pressure was elevated.  Alcohol and drug screen were negative. CTA chest showed dilated 4 mm thoracic aorta.  EEG was obtained which was negative  Hospital Course:   Syncope-unclear etiology, EEG is negative.  Telemetry did not show any significant arrhythmia.  Cardiology has seen the patient and arrange for 30-day event monitor.  Cardiology office will call to set up an event monitor at home.  Dilated ascending aorta-CT chest shows 4 mm ascending thoracic aorta.  Cardiology suspects that this could be from the undiagnosed hypertension.  Echocardiogram shows mild LVH.  We will have 48-hour blood pressure monitoring at home.   Elevated blood pressure-patient blood pressure was elevated, echo shows mild LVH.  Discussed with Dr. Mayford Knife, she is okay with sending him home on amlodipine 5 mg daily.  48-hour blood pressure monitoring will be  arranged per cardiology.  Mild transaminitis-abdominal ultrasound shows hepatic steatosis.  Hepatitis panel for hepatitis A, hepatitis B and hepatitis C was negative  Mild iron deficiency-iron was 43, saturation 12%, hemoglobin is 15.2.  Encourage dietary iron.  Would not give iron supplementation at this time.  Consider rechecking iron level in 3 months.    Procedures:  Echocardiogram  Consultations:  Cardiology  Discharge Exam: Vitals:   09/14/20 1013 09/14/20 1449  BP: 140/85 (!) 148/86  Pulse: 70 72  Resp: 14 20  Temp: 98.2 F (36.8 C) 98.4 F (36.9 C)  SpO2: 99% 100%    General: Appears in no acute distress Cardiovascular: S1-S2, regular Respiratory: Clear to auscultation bilaterally  Discharge Instructions   Discharge Instructions    Diet - low sodium heart healthy   Complete by: As directed    Increase activity slowly   Complete by: As directed      Allergies as of 09/14/2020   No Known Allergies     Medication List    You have not been prescribed any medications.    No Known Allergies  Follow-up Information    Quintella Reichert, MD Follow up.   Specialty: Cardiology Why: [facility] - Cardiology - see address for location - Monday Nov 13, 2020 at 9:20 AM (Arrive by 9:05 AM). Contact information: [address] [place] [address] [phone]        [facility] [address] Office Follow up.   Specialty: Cardiology Why: Dr. Norris Cross office will call you to arrange a 30-day  heart monitor to wear, a sleep study to evaluate for sleep apnea, and a 48-hour blood pressure monitor. Contact information: [address], [address] [place] [id number] [phone]               The results of significant diagnostics from this hospitalization (including imaging, microbiology, ancillary and laboratory) are listed below for reference.    Significant Diagnostic Studies: DG Chest 2 View  Result Date:  09/13/2020 CLINICAL DATA:  Syncope EXAM: CHEST - 2 VIEW COMPARISON:  None. FINDINGS: The heart size and mediastinal contours are within normal limits. No focal consolidation. No pulmonary edema. No pleural effusion. No pneumothorax. No acute osseous abnormality. IMPRESSION: No active cardiopulmonary disease. Electronically Signed   By: Tish FredericksonMorgane  Naveau M.D.   On: 09/13/2020 05:53   CT Head Wo Contrast  Result Date: 09/13/2020 CLINICAL DATA:  Syncopal episode, status post fall EXAM: CT HEAD WITHOUT CONTRAST TECHNIQUE: Contiguous axial images were obtained from the base of the skull through the vertex without intravenous contrast. COMPARISON:  None. FINDINGS: Brain: No evidence of large-territorial acute infarction. No parenchymal hemorrhage. No mass lesion. No extra-axial collection. No mass effect or midline shift. No hydrocephalus. Basilar cisterns are patent. Vascular: No hyperdense vessel. Skull: No acute fracture or focal lesion. Sinuses/Orbits: Paranasal sinuses and mastoid air cells are clear. The orbits are unremarkable. Other: Trace subcutaneus soft tissue edema of the right posterior scalp. IMPRESSION: No acute intracranial abnormality. Electronically Signed   By: Tish FredericksonMorgane  Naveau M.D.   On: 09/13/2020 05:26   MR BRAIN WO CONTRAST  Result Date: 09/13/2020 CLINICAL DATA:  Syncope.  Normal neuro exam.  Possible seizure. EXAM: MRI HEAD WITHOUT CONTRAST TECHNIQUE: Multiplanar, multiecho pulse sequences of the brain and surrounding structures were obtained without intravenous contrast. COMPARISON:  CT head 09/13/2020 FINDINGS: Brain: No acute infarction, hemorrhage, hydrocephalus, extra-axial collection or mass lesion. Small hyperintensities in the frontal white matter bilaterally, mild in degree. Remaining white matter normal. Brainstem normal. Vascular: Normal arterial flow voids. Skull and upper cervical spine: No focal skeletal abnormality. Sinuses/Orbits: Negative Other: None IMPRESSION: No acute  abnormality. Small hyperintensities in the frontal white matter bilaterally. These are nonspecific and could be seen with chronic ischemia, complex migraine headache, head injury. Not typical location for demyelinating disease. Electronically Signed   By: Marlan Palauharles  Clark M.D.   On: 09/13/2020 13:36   EEG adult  Result Date: 09/13/2020 Charlsie QuestYadav, Priyanka O, MD     09/13/2020 11:39 AM Patient Name: Juan ArbourGaspard F. Hiltner MRN: [id number] Epilepsy Attending: Charlsie QuestPriyanka O Yadav Referring Physician/Provider: Dr. Margie Egeyrone Kyle Date: 09/13/2020 Duration: 24.47 mins Patient history: 30 year old male with an episode of syncope.  EEG to evaluate for seizures. Level of alertness: Awake, asleep AEDs during EEG study: None Technical aspects: This EEG study was done with scalp electrodes positioned according to the 10-20 International system of electrode placement. Electrical activity was acquired at a sampling rate of 500Hz  and reviewed with a high frequency filter of 70Hz  and a low frequency filter of 1Hz . EEG data were recorded continuously and digitally stored. Description: The posterior dominant rhythm consists of 9 Hz activity of moderate voltage (25-35 uV) seen predominantly in posterior head regions, symmetric and reactive to eye opening and eye closing. Sleep was characterized by vertex waves, sleep spindles (12 to 14 Hz), maximal frontocentral region. Hyperventilation did not show any EEG change.  Physiologic photic driving was not seen during photic stimulation.  IMPRESSION: This study is within normal limits. No seizures or epileptiform discharges  were seen throughout the recording. Priyanka Annabelle Harman   CT ANGIO CHEST AORTA W/CM & OR WO/CM  Result Date: 09/13/2020 CLINICAL DATA:  Malignant hypertension, concern for aortic dissection. EXAM: CT ANGIOGRAPHY CHEST WITH CONTRAST TECHNIQUE: Multidetector CT imaging of the chest was performed using the standard protocol during bolus administration of intravenous contrast. Multiplanar  CT image reconstructions and MIPs were obtained to evaluate the vascular anatomy. CONTRAST:  OMNIPAQUE IOHEXOL 350 MG/ML SOLN COMPARISON:  Chest x-ray from September 13, 2020 FINDINGS: Cardiovascular: Aortic caliber is 4.0 cm in the axial plane. Heart size is enlarged. No pericardial effusion. No sign of aortic dissection. Three-vessel branching pattern in the chest. No sign of central or lobar pulmonary embolism. Study performed as arterial evaluation rather than PE protocol Mediastinum/Nodes: Thoracic inlet structures are normal. No axillary lymphadenopathy. No mediastinal adenopathy. No hilar adenopathy. Lungs/Pleura: No pneumothorax. No pleural effusion. No consolidation. Airways are patent. Upper Abdomen: Incidental imaging of upper abdominal contents with signs of hepatic steatosis. No pericholecystic stranding. Visualized pancreas, spleen, adrenal glands and kidneys are unremarkable. Musculoskeletal: No acute musculoskeletal process. No destructive bone finding. Review of the MIP images confirms the above findings. IMPRESSION: 1. No evidence of thoracic aortic aneurysm or dissection. 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 Suggest cardiology and or vascular surgery follow-up. Could consider echocardiography as warranted. 2. Cardiomegaly 3. No sign of central or lobar embolism. Lung base assessment limited by motion artifact. 4. Mild cardiomegaly. 5. Incidental imaging of upper abdominal contents with signs of hepatic steatosis. Electronically Signed   By: Donzetta Kohut M.D.   On: 09/13/2020 20:16   ECHOCARDIOGRAM COMPLETE  Result Date: 09/14/2020    ECHOCARDIOGRAM REPORT   Patient Name:   Juan Hendrix Date of Exam: 09/14/2020 Medical Rec #:  [id number]         Height:       79.0 in Accession #:    [id number]        Weight:       297.0 lb Date of Birth:  [birth year]         BSA:          2.696 m Patient Age:    29 years          BP:            140/85 mmHg Patient Gender: M                 HR:           63 bpm. Exam Location:  Inpatient Procedure: 2D Echo, 3D Echo, Cardiac Doppler, Color Doppler and Strain Analysis Indications:    R07.9* Chest pain, unspecified  History:        Patient has no prior history of Echocardiogram examinations.                 Signs/Symptoms:Syncope. Elevated troponin.  Sonographer:    Sheralyn Boatman RDCS Referring Phys: [id number] Teddy Spike IMPRESSIONS  1. Left ventricular ejection fraction, by estimation, is 65 to 70%. The left ventricle has normal function. The left ventricle has no regional wall motion abnormalities. There is mild left ventricular hypertrophy. Left ventricular diastolic parameters were normal. The average left ventricular global longitudinal strain is -22.6 %.  2. Right ventricular systolic function is normal. The right ventricular size is normal.  3. The mitral valve is normal in structure. Trivial mitral valve regurgitation. No evidence of mitral  stenosis.  4. The aortic valve is normal in structure. Aortic valve regurgitation is not visualized. No aortic stenosis is present. FINDINGS  Left Ventricle: Left ventricular ejection fraction, by estimation, is 65 to 70%. The left ventricle has normal function. The left ventricle has no regional wall motion abnormalities. The average left ventricular global longitudinal strain is -22.6 %. The left ventricular internal cavity size was normal in size. There is mild left ventricular hypertrophy. Left ventricular diastolic parameters were normal. Right Ventricle: The right ventricular size is normal. No increase in right ventricular wall thickness. Right ventricular systolic function is normal. Left Atrium: Left atrial size was normal in size. Right Atrium: Right atrial size was normal in size. Pericardium: There is no evidence of pericardial effusion. Mitral Valve: The mitral valve is normal in structure. Trivial mitral valve regurgitation. No evidence of mitral  valve stenosis. Tricuspid Valve: The tricuspid valve is grossly normal. Tricuspid valve regurgitation is not demonstrated. Aortic Valve: The aortic valve is normal in structure. Aortic valve regurgitation is not visualized. No aortic stenosis is present. Pulmonic Valve: The pulmonic valve was not well visualized. Pulmonic valve regurgitation is not visualized. Aorta: The aortic root and ascending aorta are structurally normal, with no evidence of dilitation. IAS/Shunts: The atrial septum is grossly normal.  LEFT VENTRICLE PLAX 2D LVIDd:         4.50 cm      Diastology LVIDs:         2.80 cm      LV e' medial:    5.66 cm/s LV PW:         1.70 cm      LV E/e' medial:  13.7 LV IVS:        1.30 cm      LV e' lateral:   12.20 cm/s LVOT diam:     2.10 cm      LV E/e' lateral: 6.4 LV SV:         107 LV SV Index:   40           2D Longitudinal Strain LVOT Area:     3.46 cm     2D Strain GLS Avg:     -22.6 %  LV Volumes (MOD) LV vol d, MOD A2C: 116.0 ml LV vol d, MOD A4C: 87.3 ml LV vol s, MOD A2C: 30.7 ml LV vol s, MOD A4C: 30.2 ml LV SV MOD A2C:     85.3 ml LV SV MOD A4C:     87.3 ml LV SV MOD BP:      74.0 ml RIGHT VENTRICLE             IVC RV S prime:     16.50 cm/s  IVC diam: 2.40 cm TAPSE (M-mode): 2.1 cm LEFT ATRIUM             Index       RIGHT ATRIUM           Index LA diam:        3.60 cm 1.34 cm/m  RA Area:     11.70 cm LA Vol (A2C):   59.8 ml 22.18 ml/m RA Volume:   24.80 ml  9.20 ml/m LA Vol (A4C):   53.8 ml 19.96 ml/m LA Biplane Vol: 59.0 ml 21.89 ml/m  AORTIC VALVE LVOT Vmax:   152.00 cm/s LVOT Vmean:  110.000 cm/s LVOT VTI:    0.309 m  AORTA Ao Root diam: 3.40 cm Ao Asc diam:  3.50 cm  MITRAL VALVE MV Area (PHT): 2.95 cm    SHUNTS MV Decel Time: 257 msec    Systemic VTI:  0.31 m MV E velocity: 77.50 cm/s  Systemic Diam: 2.10 cm MV A velocity: 55.30 cm/s MV E/A ratio:  1.40 Kristeen Miss MD Electronically signed by Kristeen Miss MD Signature Date/Time: 09/14/2020/3:14:18 PM    Final    US Abdomen  Limited RUQ (LIVER/GB)  Result Date: 09/13/2020 CLINICAL DATA:  Abnormal labs EXAM: ULTRASOUND ABDOMEN LIMITED RIGHT UPPER QUADRANT COMPARISON:  None. FINDINGS: Gallbladder: No gallstones or wall thickening visualized. No sonographic Murphy sign noted by sonographer. Minimal dependent sludge Common bile duct: Diameter: 5-6 mm. Where visualized, no filling defect. Normal biliary labs. Liver: Echogenic liver mild sparing at the gallbladder fossa. No focal masslike finding. Portal vein is patent on color Doppler imaging with normal direction of blood flow towards the liver. Other: None. IMPRESSION: Hepatic steatosis. Mild gallbladder sludge. Electronically Signed   By: Marnee Spring M.D.   On: 09/13/2020 11:36    Microbiology: Recent Results (from the past 240 hour(s))  Resp Panel by RT-PCR (Flu A&B, Covid) Nasopharyngeal Swab     Status: None   Collection Time: 09/13/20  6:00 AM   Specimen: Nasopharyngeal Swab; Nasopharyngeal(NP) swabs in vial transport medium  Result Value Ref Range Status   SARS Coronavirus 2 by RT PCR NEGATIVE NEGATIVE Final    Comment: (NOTE) SARS-CoV-2 target nucleic acids are NOT DETECTED.  The SARS-CoV-2 RNA is generally detectable in upper respiratory specimens during the acute phase of infection. The lowest concentration of SARS-CoV-2 viral copies this assay can detect is 138 copies/mL. A negative result does not preclude SARS-Cov-2 infection and should not be used as the sole basis for treatment or other patient management decisions. A negative result may occur with  improper specimen collection/handling, submission of specimen other than nasopharyngeal swab, presence of viral mutation(s) within the areas targeted by this assay, and inadequate number of viral copies(<138 copies/mL). A negative result must be combined with clinical observations, patient history, and epidemiological information. The expected result is Negative.  Fact Sheet for Patients:   BloggerCourse.com  Fact Sheet for Healthcare Providers:  SeriousBroker.it  This test is no t yet approved or cleared by the [place] FDA and  has been authorized for detection and/or diagnosis of SARS-CoV-2 by FDA under an Emergency Use Authorization (EUA). This EUA will remain  in effect (meaning this test can be used) for the duration of the COVID-19 declaration under Section 564(b)(1) of the Act, 21 U.S.C.section 360bbb-3(b)(1), unless the authorization is terminated  or revoked sooner.       Influenza A by PCR NEGATIVE NEGATIVE Final   Influenza B by PCR NEGATIVE NEGATIVE Final    Comment: (NOTE) The Xpert Xpress SARS-CoV-2/FLU/RSV plus assay is intended as an aid in the diagnosis of influenza from Nasopharyngeal swab specimens and should not be used as a sole basis for treatment. Nasal washings and aspirates are unacceptable for Xpert Xpress SARS-CoV-2/FLU/RSV testing.  Fact Sheet for Patients: BloggerCourse.com  Fact Sheet for Healthcare Providers: SeriousBroker.it  This test is not yet approved or cleared by the [place] FDA and has been authorized for detection and/or diagnosis of SARS-CoV-2 by FDA under an Emergency Use Authorization (EUA). This EUA will remain in effect (meaning this test can be used) for the duration of the COVID-19 declaration under Section 564(b)(1) of the Act, 21 U.S.C. section 360bbb-3(b)(1), unless the authorization is terminated or revoked.  Performed at [facility], [address] [place], [place] [address]      Labs: Basic Metabolic Panel: Recent Labs  Lab 09/13/20 0436 09/13/20 1122 09/14/20 0516  NA 139  --  139  K 3.6  --  3.4*  CL 104  --  103  CO2 28  --  27  GLUCOSE 113*  --  105*  BUN 16  --  12  CREATININE 0.99 0.94 1.00  CALCIUM 9.0  --  9.3  MG  --   --  2.2   Liver Function  Tests: Recent Labs  Lab 09/13/20 0436 09/14/20 0516  AST 60* 30  ALT 70* 51*  ALKPHOS 56 50  BILITOT 0.6 1.2  PROT 8.2* 7.4  ALBUMIN 4.3 3.8   No results for input(s): LIPASE, AMYLASE in the last 168 hours. No results for input(s): AMMONIA in the last 168 hours. CBC: Recent Labs  Lab 09/13/20 0436 09/13/20 1122 09/14/20 0516  WBC 8.1 7.4 7.3  NEUTROABS 6.0  --   --   HGB 15.1 14.6 15.2  HCT 48.3 45.5 47.8  MCV 79.7* 78.6* 78.1*  PLT 190 188 201     CBG: Recent Labs  Lab 09/14/20 0644  GLUCAP 115*       Signed:  Meredeth Ide MD.  [facility] 09/14/2020, 4:39 PM

## 2020-09-19 ENCOUNTER — Ambulatory Visit (INDEPENDENT_AMBULATORY_CARE_PROVIDER_SITE_OTHER): Payer: Medicaid Other

## 2020-09-19 DIAGNOSIS — R55 Syncope and collapse: Secondary | ICD-10-CM

## 2020-11-13 ENCOUNTER — Other Ambulatory Visit: Payer: Self-pay

## 2020-11-13 ENCOUNTER — Encounter: Payer: Self-pay | Admitting: Cardiology

## 2020-11-13 ENCOUNTER — Ambulatory Visit (INDEPENDENT_AMBULATORY_CARE_PROVIDER_SITE_OTHER): Payer: Self-pay | Admitting: Cardiology

## 2020-11-13 ENCOUNTER — Ambulatory Visit (INDEPENDENT_AMBULATORY_CARE_PROVIDER_SITE_OTHER): Payer: Self-pay

## 2020-11-13 VITALS — BP 120/90 | HR 62 | Ht 79.0 in | Wt 297.0 lb

## 2020-11-13 DIAGNOSIS — R55 Syncope and collapse: Secondary | ICD-10-CM

## 2020-11-13 DIAGNOSIS — I712 Thoracic aortic aneurysm, without rupture, unspecified: Secondary | ICD-10-CM

## 2020-11-13 DIAGNOSIS — R030 Elevated blood-pressure reading, without diagnosis of hypertension: Secondary | ICD-10-CM

## 2020-11-13 DIAGNOSIS — I10 Essential (primary) hypertension: Secondary | ICD-10-CM

## 2020-11-13 MED ORDER — AMLODIPINE BESYLATE 5 MG PO TABS: 7.5000 mg | ORAL_TABLET | Freq: Every day | ORAL | 3 refills | Status: DC

## 2020-11-13 NOTE — Addendum Note (Signed)
Addended by: Theresia Majors on: 11/13/2020 10:01 AM   Modules accepted: Orders

## 2020-11-13 NOTE — Patient Instructions (Addendum)
Medication Instructions:  Your physician has recommended you make the following change in your medication:  1) INCREASE amlodipine to 7.5 mg daily  *If you need a refill on your cardiac medications before your next appointment, please call your pharmacy*  Follow-Up: At [facility], you and your health needs are our priority.  As part of our continuing mission to provide you with exceptional heart care, we have created designated Provider Care Teams.  These Care Teams include your primary Cardiologist (physician) and Advanced Practice Providers (APPs -  Physician Assistants and Nurse Practitioners) who all work together to provide you with the care you need, when you need it.  We recommend signing up for the patient portal called "MyChart".  Sign up information is provided on this After Visit Summary.  MyChart is used to connect with patients for Virtual Visits (Telemedicine).  Patients are able to view lab/test results, encounter notes, upcoming appointments, etc.  Non-urgent messages can be sent to your provider as well.   To learn more about what you can do with MyChart, go to [email or web address].    Your next appointment:   6 month(s)  The format for your next appointment:   In Person  Provider:   You may see Armanda Magic, MD or one of the following Advanced Practice Providers on your designated Care Team:    Ronie Spies, PA-C  Jacolyn Reedy, PA-C

## 2020-11-13 NOTE — Progress Notes (Addendum)
Cardiology Office Note:    Date:  11/13/2020   ID:  Edd Arbour, DOB [birth year], MRN [id number]  PCP:  Pcp, No  Cardiologist:  Armanda Magic, MD    Referring MD: No ref. provider found   Chief Complaint  Patient presents with  . Follow-up    Syncope, HTN, dilated thoracic aorta    History of Present Illness:    Juan Hendrix is a 30 y.o. male with a hx of obesity and Asperger's syndrome. He recently was admitted to ER with syncope.  He  was up at 2am working on [employer] and then found himself on the bathroom floor and did not remember how he got there.  His mom heard a noise but was in bed and ignored it but then heard him calling out.  She found him on the floor with his body shaking.  He had no incontinence or tongue biting and denied any sx of chest pain or pressure, SOB, palpitations or dizziness.  His mom checked his BS which was normal.  The patient was very weak and unable to get up for 45 minutes.  He was brought to the ER where workup showed SCr was 0.99, Hbg 15.1.  hsTrop was mildly elevated with flat trend at 169>167.    Workup in the hospital included a 2D echo that showed normal LVF with mild LVH and it was felt that he had undiagnosed HTN.  Chest CTA showed dilated thoracic aorta at 4cm.  A sleep study was recommended outpt which he has not had done yet.  A 48 hour BP monitor was ordered as well as a 30 day event monitor.  Event monitor showed no arrhythmias.    He is here today for followup and is doing well.  He denies any chest pain or pressure, SOB, DOE, PND, orthopnea, LE edema, dizziness, palpitations or syncope. He is compliant with his meds and is tolerating meds with no SE.    Past Medical History:  Diagnosis Date  . Asperger syndrome     No past surgical history on file.  Current Medications: Current Meds  Medication Sig  . amLODipine (NORVASC) 5 MG tablet Take 1.5 tablets (7.5 mg total) by mouth daily.  Marland Kitchen guaiFENesin (MUCINEX PO) Take 1  tablet by mouth as directed.  . [DISCONTINUED] amLODipine (NORVASC) 5 MG tablet Take 1 tablet (5 mg total) by mouth daily.     Allergies:   Patient has no known allergies.   Social History   Socioeconomic History  . Marital status: Single    Spouse name: Not on file  . Number of children: Not on file  . Years of education: Not on file  . Highest education level: Not on file  Occupational History  . Not on file  Tobacco Use  . Smoking status: Never Smoker  . Smokeless tobacco: Never Used  Vaping Use  . Vaping Use: Never used  Substance and Sexual Activity  . Alcohol use: Never  . Drug use: Never  . Sexual activity: Not on file  Other Topics Concern  . Not on file  Social History Narrative  . Not on file   Social Determinants of Health   Financial Resource Strain: Not on file  Food Insecurity: Not on file  Transportation Needs: Not on file  Physical Activity: Not on file  Stress: Not on file  Social Connections: Not on file     Family History: The patient's family history includes Stroke in his maternal grandmother.  ROS:   Please see the history of present illness.    ROS  All other systems reviewed and negative.   EKGs/Labs/Other Studies Reviewed:    The following studies were reviewed today: 2D echo 08/2020 IMPRESSIONS   1. Left ventricular ejection fraction, by estimation, is 65 to 70%. The  left ventricle has normal function. The left ventricle has no regional  wall motion abnormalities. There is mild left ventricular hypertrophy.  Left ventricular diastolic parameters  were normal. The average left ventricular global longitudinal strain is  -22.6 %.  2. Right ventricular systolic function is normal. The right ventricular  size is normal.  3. The mitral valve is normal in structure. Trivial mitral valve  regurgitation. No evidence of mitral stenosis.  4. The aortic valve is normal in structure. Aortic valve regurgitation is  not visualized. No  aortic stenosis is present.   Chest CTA 08/2020 IMPRESSION: 1. No evidence of thoracic aortic aneurysm or dissection. 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 Suggest cardiology and or vascular surgery follow-up. Could consider echocardiography as warranted. 2. Cardiomegaly 3. No sign of central or lobar embolism. Lung base assessment limited by motion artifact. 4. Mild cardiomegaly. 5. Incidental imaging of upper abdominal contents with signs of hepatic steatosis.   EKG:  EKG is not ordered today.   Recent Labs: 09/13/2020: TSH 3.541 09/14/2020: ALT 51; BUN 12; Creatinine, Ser 1.00; Hemoglobin 15.2; Magnesium 2.2; Platelets 201; Potassium 3.4; Sodium 139   Recent Lipid Panel No results found for: CHOL, TRIG, HDL, CHOLHDL, VLDL, LDLCALC, LDLDIRECT   Physical Exam:    VS:  BP 120/90   Pulse 62   Ht 6\' 7"  (2.007 m)   Wt 297 lb (134.7 kg)   SpO2 96%   BMI 33.46 kg/m     Wt Readings from Last 3 Encounters:  11/13/20 297 lb (134.7 kg)  09/14/20 296 lb 15.4 oz (134.7 kg)     GEN:  Well nourished, well developed in no acute distress HEENT: Normal NECK: No JVD; No carotid bruits LYMPHATICS: No lymphadenopathy CARDIAC: RRR, no murmurs, rubs, gallops RESPIRATORY:  Clear to auscultation without rales, wheezing or rhonchi  ABDOMEN: Soft, non-tender, non-distended MUSCULOSKELETAL:  No edema; No deformity  SKIN: Warm and dry NEUROLOGIC:  Alert and oriented x 3 PSYCHIATRIC:  Normal affect   ASSESSMENT:    1. Syncope, unspecified syncope type   2. Thoracic aortic aneurysm without rupture (HCC)   3. Primary hypertension    PLAN:    In order of problems listed above:  Syncope -etiology unclear -2D echo normal except for dilated thoracic aorta -event monitor with no arrhythmias -he has not had any further dizziness or syncope  -if he has further syncopal spells will need to refer to EP for ILR  Dilated  thoracic aorta -noted to be 4cm by CT>>this may be normal for his height and weight -will refer to CVTS to follow given his young age -Needs better BP control with goal < 130/44mmHg  HTN -still with elevated DBP -he went out to eat with his mom yesterday and ate a cheese stick that his mom said had a lot of Na -his mom checks the BP at home and his DBP is running in the low 90's -increase amlodipine to 7.5mg  daily -I will get a 48 hr BP monitor to assess adequacy of BP control  Followup with me in 6 months  Medication Adjustments/Labs and Tests  Ordered: Current medicines are reviewed at length with the patient today.  Concerns regarding medicines are outlined above.  Orders Placed This Encounter  Procedures  . Ambulatory referral to Cardiothoracic Surgery   Meds ordered this encounter  Medications  . amLODipine (NORVASC) 5 MG tablet    Sig: Take 1.5 tablets (7.5 mg total) by mouth daily.    Dispense:  135 tablet    Refill:  3    Signed, Armanda Magic, MD  11/13/2020 10:07 AM    [facility]

## 2020-11-29 ENCOUNTER — Telehealth: Payer: Self-pay

## 2020-11-29 DIAGNOSIS — I10 Essential (primary) hypertension: Secondary | ICD-10-CM

## 2020-11-29 DIAGNOSIS — R030 Elevated blood-pressure reading, without diagnosis of hypertension: Secondary | ICD-10-CM

## 2020-11-29 MED ORDER — AMLODIPINE BESYLATE 10 MG PO TABS: 10.0000 mg | ORAL_TABLET | Freq: Every day | ORAL | 3 refills | Status: DC

## 2020-11-29 NOTE — Telephone Encounter (Signed)
The patient has been notified of the result and verbalized understanding.  All questions (if any) were answered. Theresia Majors, RN 11/29/2020 12:05 PM  Patient will increase amlodipine to 10 mg per day. Referral placed for HTN clinic.

## 2020-11-29 NOTE — Telephone Encounter (Signed)
-----   Message from Quintella Reichert, MD sent at 11/27/2020  8:58 PM EDT ----- BP still too high - increase amlodipine to 10mg  daily and cut back on Na (<2gm Na diet) - followup in PharmD HTn clinic in 2 weeks

## 2020-12-07 ENCOUNTER — Institutional Professional Consult (permissible substitution) (INDEPENDENT_AMBULATORY_CARE_PROVIDER_SITE_OTHER): Payer: Self-pay | Admitting: Cardiothoracic Surgery

## 2020-12-07 ENCOUNTER — Other Ambulatory Visit: Payer: Self-pay

## 2020-12-07 VITALS — BP 118/80 | HR 67 | Temp 97.7°F | Resp 20 | Ht 79.0 in | Wt 297.0 lb

## 2020-12-07 DIAGNOSIS — I712 Thoracic aortic aneurysm, without rupture: Secondary | ICD-10-CM

## 2020-12-07 DIAGNOSIS — I7121 Aneurysm of the ascending aorta, without rupture: Secondary | ICD-10-CM

## 2020-12-13 ENCOUNTER — Telehealth: Payer: Self-pay | Admitting: Cardiology

## 2020-12-13 NOTE — Telephone Encounter (Signed)
Patient told me to hold one but ended up disconneting

## 2021-01-03 ENCOUNTER — Other Ambulatory Visit: Payer: Self-pay

## 2021-01-03 ENCOUNTER — Ambulatory Visit (INDEPENDENT_AMBULATORY_CARE_PROVIDER_SITE_OTHER): Payer: Self-pay | Admitting: Pharmacist

## 2021-01-03 DIAGNOSIS — I10 Essential (primary) hypertension: Secondary | ICD-10-CM

## 2021-01-03 MED ORDER — VALSARTAN 40 MG PO TABS: 40.0000 mg | ORAL_TABLET | Freq: Every day | ORAL | 3 refills | Status: AC

## 2021-01-03 NOTE — Patient Instructions (Addendum)
Please start taking valsartan 40mg  daily Continue amlodipine 10mg  daily  Call me at [phone] with any questions  book- Spoon Fed Pod Cast- ZOE  Seel cut oats- use fruit/nuts to flavor No soda/juice Plenty of vegetables/nuts little starch Watch processed meats/foods for high sodium

## 2021-01-03 NOTE — Progress Notes (Signed)
Patient ID: Benjamen Koelling                 DOB: Sep 07, 1990                      MRN: [id number]     HPI: Westyn Driggers is a 30 y.o. male referred by Dr. Mayford Knife to HTN clinic. PMH is significant for obesity and Asperger's syndrome. He had a syncope episode 09/13/20. Workup has been unremarkable for cause. His diastolic BP has been elevated. He wore a 48hr monitor that showed an average BP of 148/88. Amlodipine was increased to 10mg  daily after wearing monitor.   Mean Std. Dev.   Max (time) Min (time)  SYS (mmHg) 148 +/-14.3  200 (12:02) 127 (08:43)  DIA (mmHg) 88 +/-14.8  158 (12:02) 62 (14:03)  HR (bpm) 67 +/-7.6   88 (11:42) 57 (08:57)   Patient presents today accompanied by his mother. His mom has many questions about diet. She is from [place], prefers natural medicine. She doesn't want him to be on medicine for so long since he is so young. She cooks for him and wants to make sure she is preparing him healthy foods. She is also diabetic and doesn't want him to become diabetic too.  Patient denies dizziness/ lightheadedness. He stopped going for walks after his syncopal fall in March, but started back about 1 week ago. Checks his blood pressure at home.  124/84, 127/89, 134/88 not usually any lower. Does eat some processed meats.    Current HTN meds: amlodipine 10mg  daily Previously tried: none BP goal: <130/80  Family History: The patient's family history includes Stroke in his maternal grandmother.  Social History: never smoked  Diet: ginger ale, ham, April drum stick, meatballs, tuna in a can Breakfast: instant oatmeal , omelette w/ vegetables  Exercise: use to walk everyday or exercise machine in winter. Fell in march -just resumed walking 30-68min most days  Home BP readings: 124/84, 127/89, 134/88  Wt Readings from Last 3 Encounters:  12/07/20 297 lb (134.7 kg)  11/13/20 297 lb (134.7 kg)  09/14/20 296 lb 15.4 oz (134.7 kg)   BP Readings from Last 3 Encounters:   12/07/20 118/80  11/13/20 120/90  09/14/20 (!) 148/86   Pulse Readings from Last 3 Encounters:  12/07/20 67  11/13/20 62  09/14/20 72    Renal function: CrCl cannot be calculated (Patient's most recent lab result is older than the maximum 21 days allowed.).  Past Medical History:  Diagnosis Date   Asperger syndrome     Current Outpatient Medications on File Prior to Visit  Medication Sig Dispense Refill   amLODipine (NORVASC) 10 MG tablet Take 1 tablet (10 mg total) by mouth daily. 90 tablet 3   guaiFENesin (MUCINEX PO) Take 1 tablet by mouth as directed. (Patient not taking: Reported on 12/07/2020)     No current facility-administered medications on file prior to visit.    No Known Allergies   Assessment/Plan:  1. Hypertension - Blood pressure above goal of <130/80 in clinic. I had a long discussion with patient and his mother about diet. See some recommendations in AVS. I explained to them the importance of getting his blood pressure under control now. His lifestyle changes may take some time to start working (weight loss/exercise). As blood pressure drops, if it comes to a level we feel is safe to back off of medications (110's systolic/60's diastolic), we may be able to back off  on some medications. Start taking valsartan 40mg  daily. Continue amlodipine 10mg  daily. Follow up in clinic with BMP in 2 weeks.  Thanks,  , Pharm.D, BCPS, CPP [facility]  [address], [place], [address] [place]  Phone: [phone]; Fax: [phone]

## 2021-01-18 ENCOUNTER — Other Ambulatory Visit: Payer: Self-pay

## 2021-01-18 ENCOUNTER — Ambulatory Visit (INDEPENDENT_AMBULATORY_CARE_PROVIDER_SITE_OTHER): Payer: Self-pay | Admitting: Pharmacist

## 2021-01-18 VITALS — BP 110/82 | HR 58

## 2021-01-18 DIAGNOSIS — I10 Essential (primary) hypertension: Secondary | ICD-10-CM

## 2021-01-18 NOTE — Patient Instructions (Addendum)
Please start checking your blood pressure once a day. Ideally at least a few hours after you wake up  Continue amlodipine 10mg  daily and valsartan 40mg  daily.  Call me at [phone] with any questions  Hypertension "High blood pressure"  Hypertension is often called "The Silent Killer." It rarely causes symptoms until it is extremely  high or has done damage to other organs in the body. For this reason, you should have your  blood pressure checked regularly by your physician. We will check your blood pressure  every time you see a provider at one of our offices.   Your blood pressure reading consists of two numbers. Ideally, blood pressure should be  below 120/80. The first ("top") number is called the systolic pressure. It measures the  pressure in your arteries as your heart beats. The second ("bottom") number is called the diastolic pressure. It measures the pressure in your arteries as the heart relaxes between beats.  The benefits of getting your blood pressure under control are enormous. A 10-point  reduction in systolic blood pressure can reduce your risk of stroke by 27% and heart failure by 28%  Your blood pressure goal is <130/80  To check your pressure at home you will need to:  1. Sit up in a chair, with feet flat on the floor and back supported. Do not cross your ankles or legs. 2. Rest your left arm so that the cuff is about heart level. If the cuff goes on your upper arm,  then just relax the arm on the table, arm of the chair or your lap. If you have a wrist cuff, we  suggest relaxing your wrist against your chest (think of it as Pledging the Flag with the  wrong arm).  3. Place the cuff snugly around your arm, about 1 inch above the crook of your elbow. The  cords should be inside the groove of your elbow.  4. Sit quietly, with the cuff in place, for about 5 minutes. After that 5 minutes press the power  button to start a reading. 5. Do not talk or move while  the reading is taking place.  6. Record your readings on a sheet of paper. Although most cuffs have a memory, it is often  easier to see a pattern developing when the numbers are all in front of you.  7. You can repeat the reading after 1-3 minutes if it is recommended  Make sure your bladder is empty and you have not had caffeine or tobacco within the last 30 min  Always bring your blood pressure log with you to your appointments. If you have not brought your monitor in to be double checked for accuracy, please bring it to your next appointment.  You can find a list of validated (accurate) blood pressure cuffs at  Healthy Diet  SALT  What is the big deal with sodium? Why the need to limit our intake? What is the connection to  blood pressure? And what is the difference between salt and sodium? Sodium attracts water. Think about it. When you eat an overly salty snack, you tend to become  thirsty and need more water. If you have too much sodium in your bloodstream, your body will  then pull water into the bloodstream as well, trying to correct the imbalance. When you have  more volume in the bloodstream, your blood pressure goes up. Your heart must work harder to  pump the extra volume, and the increase in  pressure can wear out blood vessels faster. You may  also notice bloating and weight gain. Hypertension is one of the leading risk factors for heart  disease. By limiting sodium intake throughout life you are helping decrease your risk of heart  disease later on.   Salt is made up of two minerals. Sodium and chloride. A teaspoon of salt contains about 40%  sodium and 60% chloride. One teaspoon of salt has 2,300 mg of sodium. While the current  USDA guideline states you should consume no more than 2,300 mg per day, both they and the  American Heart Association recommend that you limit this to 1,500 mg to stay healthy. Sea salt  or Himalayan pink salt may have a slightly  different taste, but they still have almost the same  percent of sodium per teaspoon. So feel free to use them instead of table salt, but don't use  more.  A common myth is that if you don't add salt to your food, you are following a low sodium diet.  However, 75% of the sodium consumed in the American diet is from processed foods, NOT the  salt shaker. We all know that chips and crackers are high in sodium, but there are many other  foods that we may not think of when limiting our sodium. Below are the "salty six" foods that  the American Heart Association wants you to be aware of. 1. Cold cuts - even the healthy sliced [place] can have over 1,000 mg of sodium per slice.   Compare different brands to see which has less sodium if you eat these regularly 2. Pizza - depending on your toppings, a slice of pizza can have up to 760 mg of   sodium. Put more veggies on it or just have a slice with a side salad and still enjoy. 3. Soup - yes even that old home remedy of chicken soup is loaded with sodium. Look   for low sodium versions. Or add a bunch of frozen veggies when heating it, this will   give you less sodium per serving 4. Breads - they may not taste salty, but a single slice of bread can have up to 230 mg of   sodium. Toast for breakfast, a sandwich at lunch and a dinner roll can quickly add up   to over 900 mg in just one day.  5. Chicken - some fresh or frozen chicken is injected with a sodium solution before it   reaches the store. A 4 oz serving should have no more than 100 mg sodium. And   watch for breaded frozen chicken nuggets, strips and tenders. They may seem like a   quick and easy "healthy" meal, but they have high amounts of sodium as well. 6. Burritos/tacos - just 2 teaspoons of taco seasoning can have over 400 mg sodium.   Try making your own with equal parts of cumin, oregano, chili powder and garlic powder.   SUGAR  Sugar is a huge problem in the modern day diet. Sugar  is a HUGE contributor to heart disease, diabetes, high triglyceride levels, fatty liver diease and obesity. Sugar is hidden in almost all packaged foods/beverages. It adds no nutritional benefit to your body and can cause major harm. Added sugar is extra sugar that is added beyond what is naturally found. The American Heart Association recommends limiting added sugars to no more than 25g for women and 36 grams for men per day.  There are many names  for sugar maltose, sucrose (names ending in "ose"), high fructose corn syrup, molasses, cane sugar, corn sweetener, raw sugar, syrup, honey or fruit juice concentrate.   One of the best ways to limit your added sugars is to stop drinking sweetened beverages such as soda, sweet tea, fruit juice or fancy coffee's. There is 65g of added sugars in one 20oz bottle of Coke!! That is equal to 6 donuts.   Pay attention and read all nutrition facts labels. Below is an examples of a nutrition facts label. The #1 is showing you the total sugars where the # 2 is showing you the added sugars. This one severing has almost the max amount of added sugars per day!  Watch out for items that say "low fat" or "no added sugar" as these products are typically very high in sugar. The food industry uses these terms to fool you into thinking they are healthy.  For more information on the dangers of sugar watch WHY Sugar is as Bad as Alcohol (Fructose, The Liver Toxin) on YouTube.    EXERCISE  Exercise can help lower your blood pressure ~5 points systolic (top #) and 8 points diastolic (bottom #)  Exercise is good. We've all heard that. In an ideal world, we would all have time and resources to  get plenty of it. When you are active your heart pumps more efficiently and you will feel better.  Multiple studies show that even walking regularly has benefits that include living a longer life.  The American Heart Association recommends 90-150 minutes per week of exercise (30  minutes  per day most days of the week). You can do this in any increment you wish. Nine or more  10-minute walks count. So does an hour-long exercise class. Break the time apart into what will  work in your life. Some of the best things you can do include walking briskly, jogging, cycling or  swimming laps. Not everyone is ready to "exercise." Sometimes we need to start with just getting active. Here  are some easy ways to be more active throughout the day:  Take the stairs instead of the elevator  Go for a 10-15 minute walk during your lunch break (find a friend to make it more enjoyable)  When shopping, park at the back of the parking lot  If you take public transportation, get off one stop early and walk the extra distance  Pace around while making phone calls (most of [place] are not attached to phone cords any longer!) Check with your doctor if you aren't sure what your limitations may be. Always remember to drink plenty of water when doing any type of exercise. Don't feel like a failure if you're not getting the 90-150 minutes per week. If you started by being  a couch potato, then just a 10-minute walk each day is a huge improvement. Start with little  victories and work your way up.   Healthy Eating Tips  When looking to improve your eating habits, whether to lose weight, lower blood pressure or just be healthier, it helps to know what a serving size is.   Grains 1 slice of bread,  bagel,  cup pasta or rice  Vegetables 1 cup fresh or raw vegetables,  cup cooked or canned Fruits 1 piece of medium sized fruit,  cup canned,   Meats/Proteins  cup dried       1 oz meat, 1 egg,  cup cooked beans, nuts or seeds  Dairy  Fats Individual yogurt container, 1 cup (8oz)    1 teaspoon margarine/butter or vegetable  milk or milk alternative, 1 slice of cheese          oil; 1 tablespoon mayonnaise or salad dressing                  Plan ahead: make a menu of the meals for a week  then create a grocery list to go with  that menu. Consider meals that easily stretch into a night of leftovers, such as stews or  casseroles. Or consider making two of your favorite meal and put one in the freezer or fridge for  another night.  When you get home from the grocery store wash and prepare your vegetables and fruits.  Then when you need them they are ready to go.  Tips for going to the grocery store:  Buy store or generic brands  Check the weekly ad from your store on-line or in their in-store flyer  Look at the unit price on the shelf tag to compare/contrast the costs of different items  Buy fruits/vegetables in season  Carrots, bananas and apples are low-cost, naturally healthy items  If meats or frozen vegetables are on sale, buy some extras and put in your freezer  Limit buying prepared or "ready to eat" items, even if they are pre-made salads or fruit snacks  Do not shop when you're hungry  Foods at eye level tend to be more expensive. Look on the high and low shelves for deals.  Consider shopping at the farmer's market for fresh foods in season.  Choose canned tuna or salmon instead of fresh  Avoid the cookie and chip aisles (these are expensive, high in calories and low in  nutritional value) Healthy food preparations:  If you can't get lean hamburger, be sure to drain the fat when cooking  Steam, saut (in olive oil), grill or bake foods  Experiment with different seasonings to avoid adding salt to your foods. Kosher salt, sea salt and Himalayan salt are all still salt and should be avoided          Aim to have one 12 hour fast each day. This means no eating after dinner until breakfast. For example, if you eat dinner around 6 PM then you would not eat anything until 6 AM the next day. This is a great way to help lower your insulin levels, lose weight and reduce your blood pressure.  Resources: American Heart Association - MartiniMobile.it Go to the  Healthy Living tab to get more information American Diabetes Association - www.diabetes.org You don't have to be diabetic - check out the Food and Fitness tab  DASH diet - PopSteam.is Health topics - or just search on their home page for DASH Quit for Life - www.cancer.org Follow the Stay Healthy tab to learn more about smoking cessatio

## 2021-01-18 NOTE — Progress Notes (Signed)
Patient ID: Juan Hendrix                 DOB: 01-04-1991                      MRN: [id number]     HPI: Juan Hendrix is a 30 y.o. male referred by Dr. Mayford Knife to HTN clinic. PMH is significant for obesity and Asperger's syndrome. He had a syncope episode 09/13/20. Workup has been unremarkable for cause. His diastolic BP has been elevated. He wore a 48hr monitor that showed an average BP of 148/88. Amlodipine was increased to 10mg  daily after wearing monitor.   Patient was seen in PharmD clinic on 01/03/21. He was making several lifestyle changes including diet and he has just resumed walking most days of the week. He and his mother did not really want to add more medication. We discussed the importance of getting his blood pressure under control. If lifestyle changes start to lower BP even more, we could discuss stopping a blood pressure medication. They were agreeable to adding valsartan 40mg  daily.  Patient presents today accompanied by his mother. He brings in only had 2 blood pressure readings. Both are above goal. He was checking BP sitting up in bed when he wakes up. Denies dizziness, light headedness, SOB or swelling. Mom thinks he is eating too large of portions of food (pasta). Is walking in the AM most days of the week. Take 1 BP med in the AM and the other in the PM. Doesn't take NSAIDS or decongestants.  Current HTN meds: amlodipine 10mg  daily, valsartan 40mg  Previously tried: none BP goal: <130/80  Family History: The patient's family history includes Stroke in his maternal grandmother.  Social History: never smoked  Diet: ginger ale, ham, 03/06/21 drum stick, meatballs, tuna in a can Breakfast: instant oatmeal , omelette w/ vegetables  Exercise: use to walk everyday or exercise machine in winter. Fell in march -just resumed walking 30-25min most days  Home BP readings: 135/90 HR 60, 140/96  Wt Readings from Last 3 Encounters:  12/07/20 297 lb (134.7 kg)  11/13/20  297 lb (134.7 kg)  09/14/20 296 lb 15.4 oz (134.7 kg)   BP Readings from Last 3 Encounters:  01/03/21 120/84  12/07/20 118/80  11/13/20 120/90   Pulse Readings from Last 3 Encounters:  01/03/21 (!) 56  12/07/20 67  11/13/20 62    Renal function: CrCl cannot be calculated (Patient's most recent lab result is older than the maximum 21 days allowed.).  Past Medical History:  Diagnosis Date   Asperger syndrome     Current Outpatient Medications on File Prior to Visit  Medication Sig Dispense Refill   amLODipine (NORVASC) 10 MG tablet Take 1 tablet (10 mg total) by mouth daily. 90 tablet 3   guaiFENesin (MUCINEX PO) Take 1 tablet by mouth as directed. (Patient not taking: Reported on 12/07/2020)     valsartan (DIOVAN) 40 MG tablet Take 1 tablet (40 mg total) by mouth daily. 90 tablet 3   No current facility-administered medications on file prior to visit.    No Known Allergies   Assessment/Plan:  1. Hypertension - Blood pressure above goal of <130/80 in clinic. Blood pressure in clinic is much closer to goal that his home readings. Discussed proper blood pressure checking technique and encouraged patient to wait a few hours after waking before checking BP. Will continue amlodipine 10mg  daily and valsartan 40mg  daily. Follow up in 3  weeks.   Thanks,  Olene Floss, Pharm.D, BCPS, CPP [facility]  [address], [place], [place] [address]  Phone: [phone]; Fax: [phone]

## 2021-02-01 NOTE — Progress Notes (Signed)
[address]       [place] [address]             [phone]     CARDIOTHORACIC SURGERY office visitation  Referring Provider is Quintella Reichert, MD Primary Cardiologist is Armanda Magic, MD PCP is Pcp, No  Chief Complaint  Patient presents with   Thoracic Aortic Aneurysm    Surgical consult, CTA Chest 09/13/20, ECHO 09/14/20    HPI:  30 year old man presents for assessment of incidentally discovered ascending aortic dilation.  He was admitted recently after a fall at home that was associated with chest discomfort.  He had elevated blood pressure at the time documented.  Upon arrival in the emergency department he underwent CT scan of the chest which showed a 4 cm ascending aortic dilation.  There is no family history of personal history of aneurysm.  He does have a history of hypertension.  Past Medical History:  Diagnosis Date   Asperger syndrome     No past surgical history on file.  Family History  Problem Relation Age of Onset   Stroke Maternal Grandmother     Social History   Socioeconomic History   Marital status: Single    Spouse name: Not on file   Number of children: Not on file   Years of education: Not on file   Highest education level: Not on file  Occupational History   Not on file  Tobacco Use   Smoking status: Never   Smokeless tobacco: Never  Vaping Use   Vaping Use: Never used  Substance and Sexual Activity   Alcohol use: Never   Drug use: Never   Sexual activity: Not on file  Other Topics Concern   Not on file  Social History Narrative   Not on file   Social Determinants of Health   Financial Resource Strain: Not on file  Food Insecurity: Not on file  Transportation Needs: Not on file  Physical Activity: Not on file  Stress: Not on file  Social Connections: Not on file  Intimate Partner Violence: Not on file    Current Outpatient Medications  Medication Sig Dispense Refill   amLODipine (NORVASC) 10 MG  tablet Take 1 tablet (10 mg total) by mouth daily. 90 tablet 3   guaiFENesin (MUCINEX PO) Take 1 tablet by mouth as directed.     valsartan (DIOVAN) 40 MG tablet Take 1 tablet (40 mg total) by mouth daily. 90 tablet 3   No current facility-administered medications for this visit.    No Known Allergies    Review of Systems:   General:  No change in weight or energy  Cardiac:  No chest pain or shortness of breath at rest or with exertion  Respiratory:  Negative  GI:   No abdominal pain or bleeding  GU:   No kidney disease  Vascular:  No claudication or venous disease  Neuro:   No strokes or TIAs  Musculoskeletal: Negative  Skin:   Negative  Psych:   History of Asberger's  Eyes:   Negative  ENT:   Negative  Hematologic:  Negative  Endocrine:  Negative     Physical Exam:   BP 118/80   Pulse 67   Temp 97.7 F (36.5 C) (Skin)   Resp 20   Ht 6\' 7"  (2.007 m)   Wt 134.7 kg   SpO2 100% Comment: RA  BMI 33.46 kg/m   General:    well-appearing  HEENT:  Unremarkable   Neck:   no JVD, no bruits, no adenopathy   Chest:   clear to auscultation, symmetrical breath sounds, no wheezes, no rhonchi   CV:   RRR, no detectable murmur   Abdomen:  soft, non-tender, no masses   Extremities:  warm, well-perfused, pulses intact throughout and symmetrical, no LE edema  Rectal/GU  Deferred  Neuro:   Grossly non-focal and symmetrical throughout  Skin:   Clean and dry, no rashes, no breakdown   Diagnostic Tests:  I personally reviewed his CT scan of the chest from 09/13/2020 which shows a mild dilation of ascending aorta not consistent with formal definition of aneurysm   Impression:  30 year old man with mild ascending aortic dilation.  He has a very low risk profile for urgent aortic condition   Plan:  Follow-up in 1 year with repeat noncontrast CT scan   I spent in excess of 15 minutes during the conduct of this office consultation and >50% of this time involved direct  face-to-face encounter with the patient for counseling and/or coordination of their care.          Level 3 Office Consult = 40 minutes         Level 4 Office Consult = 60 minutes         Level 5 Office Consult = 80 minutes  B.  Lorayne Marek, MD 02/01/2021 9:54 AM

## 2021-02-08 ENCOUNTER — Other Ambulatory Visit: Payer: Self-pay

## 2021-02-08 ENCOUNTER — Ambulatory Visit (INDEPENDENT_AMBULATORY_CARE_PROVIDER_SITE_OTHER): Payer: Self-pay | Admitting: Pharmacist

## 2021-02-08 VITALS — BP 115/80 | HR 65

## 2021-02-08 DIAGNOSIS — I10 Essential (primary) hypertension: Secondary | ICD-10-CM

## 2021-02-08 NOTE — Progress Notes (Signed)
Patient ID: Juan Hendrix                 DOB: 03/16/1991                      MRN: [id number]     HPI: Juan Hendrix is a 30 y.o. male referred by Dr. Mayford Hendrix to HTN clinic. PMH is significant for obesity and Asperger's syndrome. He had a syncope episode 09/13/20. Workup has been unremarkable for cause. His diastolic BP has been elevated. He wore a 48hr monitor that showed an average BP of 148/88. Amlodipine was increased to 10mg  daily after wearing monitor.   Patient was seen in PharmD clinic on 01/03/21. He was making several lifestyle changes including diet and he has just resumed walking most days of the week. He and his mother did not really want to add more medication. We discussed the importance of getting his blood pressure under control. If lifestyle changes start to lower BP even more, we could discuss stopping a blood pressure medication. They were agreeable to adding valsartan 40mg  daily.  At last visit on 01/18/21 patients blood pressure was 110/82. Only 2 home readings were available. No changes in medications were made.   Patient presents today accompanied by his mother. He brings in several blood pressure readings. He is now checking blood pressure sitting in a chair at the table instead of in his bed.  About 50% of them are at goal. Denies dizziness, light headedness, SOB or swelling. Is walking in the AM most days of the week. Take 1 BP med in the AM and the other in the PM. Doesn't take NSAIDS or decongestants.  Current HTN meds: amlodipine 10mg  daily, valsartan 40mg  daily Previously tried: none BP goal: <130/80  Family History: The patient's family history includes Stroke in his maternal grandmother.  Social History: never smoked, no ETOH  Diet: ginger ale, ham, drum stick, 01/20/21 meatballs, tuna in a can Breakfast: instant oatmeal , omelette w/ vegetables, doesn't drink caffeine   Exercise:  walking 30-13min most days  Home BP readings: 130/87, HR 74 129/77,  132/81, 126/76, 129/88  Wt Readings from Last 3 Encounters:  12/07/20 297 lb (134.7 kg)  11/13/20 297 lb (134.7 kg)  09/14/20 296 lb 15.4 oz (134.7 kg)   BP Readings from Last 3 Encounters:  01/18/21 110/82  01/03/21 120/84  12/07/20 118/80   Pulse Readings from Last 3 Encounters:  01/18/21 (!) 58  01/03/21 (!) 56  12/07/20 67    Renal function: CrCl cannot be calculated (Patient's most recent lab result is older than the maximum 21 days allowed.).  Past Medical History:  Diagnosis Date   Asperger syndrome     Current Outpatient Medications on File Prior to Visit  Medication Sig Dispense Refill   amLODipine (NORVASC) 10 MG tablet Take 1 tablet (10 mg total) by mouth daily. 90 tablet 3   guaiFENesin (MUCINEX PO) Take 1 tablet by mouth as directed.     valsartan (DIOVAN) 40 MG tablet Take 1 tablet (40 mg total) by mouth daily. 90 tablet 3   No current facility-administered medications on file prior to visit.    No Known Allergies   Assessment/Plan:  1. Hypertension - Blood pressure is very close to goal of <130/80 in clinic. Two out of only 5 reading at home readings are at goal. Will hold off on any changes for now. Will continue amlodipine 10mg  daily and valsartan 40mg  daily. Follow  up in 5 weeks due to patient being out of town. If BP's are not consistently at goal next visit, will need to increase valsartan to 80mg  daily.   Thanks,  , Pharm.D, BCPS, CPP [facility]  [address], [place], [place]  Phone: [phone]; Fax: [phone]

## 2021-02-08 NOTE — Patient Instructions (Addendum)
Continue checking blood pressure at home once a day  Continue amlodipine 10mg  daily and valsartan 40mg  daily.  Your blood pressure goal is less than 130/80  Call me at [phone] with any questions

## 2021-03-19 ENCOUNTER — Other Ambulatory Visit: Payer: Self-pay

## 2021-03-19 ENCOUNTER — Ambulatory Visit (INDEPENDENT_AMBULATORY_CARE_PROVIDER_SITE_OTHER): Payer: Self-pay | Admitting: Pharmacist

## 2021-03-19 VITALS — BP 110/76 | HR 70

## 2021-03-19 DIAGNOSIS — I10 Essential (primary) hypertension: Secondary | ICD-10-CM

## 2021-03-19 NOTE — Progress Notes (Signed)
Patient ID: Juan Hendrix                 DOB: 08-07-1990                      MRN: [id number]     HPI: Juan Hendrix is a 30 y.o. male referred by Dr. Mayford Knife to HTN clinic. PMH is significant for obesity and Asperger's syndrome. He had a syncope episode 09/13/20. Workup has been unremarkable for cause. His diastolic BP has been elevated. He wore a 48hr monitor that showed an average BP of 148/88. Amlodipine was increased to 10mg  daily after wearing monitor.   Patient was last seen in PharmD clinic on 02/08/21. He was making several lifestyle changes including diet and walking most days of the week. Blood pressure in clinic was 115/80. No medication changes were made.   Home BP Dizziness, lightheadedness, headache, blurred vision, SOB, swelling Walking  Patient presents today accompanied by his mother. He brings in several blood pressure readings. The majority of them are at goal. Those that are not, are very close to goal. Denies dizziness, light headedness, SOB or swelling. Is walking in the AM most days of the week. Take 1 BP med in the AM and the other in the PM. Doesn't take NSAIDS or decongestants.  Current HTN meds: amlodipine 10mg  daily, valsartan 40mg  daily Previously tried: none BP goal: <130/80  Family History: The patient's family history includes Stroke in his maternal grandmother.  Social History: never smoked, no ETOH  Diet: ginger ale, ham, 04/10/21 drum stick, meatballs, tuna in a can Breakfast: instant oatmeal , omelette w/ vegetables, doesn't drink caffeine   Exercise:  walking 30-75min most days  Home BP readings: 126/79, HR 76 127/77, 125/81, 131/80, 120/79, 119/85, 130/77, 136/90, 134/79 Wt Readings from Last 3 Encounters:  12/07/20 297 lb (134.7 kg)  11/13/20 297 lb (134.7 kg)  09/14/20 296 lb 15.4 oz (134.7 kg)   BP Readings from Last 3 Encounters:  02/08/21 115/80  01/18/21 110/82  01/03/21 120/84   Pulse Readings from Last 3 Encounters:   02/08/21 65  01/18/21 (!) 58  01/03/21 (!) 56    Renal function: CrCl cannot be calculated (Patient's most recent lab result is older than the maximum 21 days allowed.).  Past Medical History:  Diagnosis Date   Asperger syndrome     Current Outpatient Medications on File Prior to Visit  Medication Sig Dispense Refill   amLODipine (NORVASC) 10 MG tablet Take 1 tablet (10 mg total) by mouth daily. 90 tablet 3   guaiFENesin (MUCINEX PO) Take 1 tablet by mouth as directed.     valsartan (DIOVAN) 40 MG tablet Take 1 tablet (40 mg total) by mouth daily. 90 tablet 3   No current facility-administered medications on file prior to visit.    No Known Allergies   Assessment/Plan:  1. Hypertension - Blood pressure is at goal of <130/80 in clinic. The majority of home blood pressure readings are at goal. Continue valsartan 40mg  daily and amlodipine 10mg  daily. Follow up as needed.  Thanks,  04/10/21, Pharm.D, BCPS, CPP [facility]  [address] [place],   Phone: [phone]; Fax: [phone]

## 2021-03-19 NOTE — Patient Instructions (Signed)
Continue amldopine 10mg  daily and valsartan 40mg  daily  Blood pressure goal is <130/80  Call me with any issues [phone]

## 2021-03-21 NOTE — Progress Notes (Signed)
Cardiology Office Note    Date:  03/28/2021   ID:  Edd Arbour, DOB 1990-08-16, MRN [id number]   PCP:  [facility]  Cardiologist:  Armanda Magic, MD   Advanced Practice Provider:  No care team member to display Electrophysiologist:  None   [id number]   Chief Complaint  Patient presents with   Follow-up    History of Present Illness:  Juan Hendrix is a 30 y.o. male with history of obesity and Asperger's syndrome, hypertension.  Patient was hospitalized 08/2020 with syncope etiology unclear.  2D echo was normal except for dilated thoracic aorta which was 4 cm and may be normal for his height and weight.  Event monitor with no arrhythmias.  Last saw Dr. Mayford Knife 10/2020 and was referred to CVTS for dilated aorta and blood pressure was monitored by the hypertension clinic.  If recurrent syncope refer to EP for ILR.  Patient comes in for f/u with his mother. Walking 30 min daily and watching salt closely. BP running 122/74. Denies dizziness, palpitations, chest pain, syncope.Has lost 6 lbs. Asking if he needs to stay on the meds.    Past Medical History:  Diagnosis Date   Asperger syndrome     No past surgical history on file.  Current Medications: Current Meds  Medication Sig   amLODipine (NORVASC) 10 MG tablet Take 1 tablet (10 mg total) by mouth daily.   guaiFENesin (MUCINEX PO) Take 1 tablet by mouth as directed.   valsartan (DIOVAN) 40 MG tablet Take 1 tablet (40 mg total) by mouth daily.     Allergies:   Patient has no known allergies.   Social History   Socioeconomic History   Marital status: Single    Spouse name: Not on file   Number of children: Not on file   Years of education: Not on file   Highest education level: Not on file  Occupational History   Not on file  Tobacco Use   Smoking status: Never   Smokeless tobacco: Never  Vaping Use   Vaping Use: Never used  Substance and Sexual Activity   Alcohol use:  Never   Drug use: Never   Sexual activity: Not on file  Other Topics Concern   Not on file  Social History Narrative   Not on file   Social Determinants of Health   Financial Resource Strain: Not on file  Food Insecurity: Not on file  Transportation Needs: Not on file  Physical Activity: Not on file  Stress: Not on file  Social Connections: Not on file     Family History:  The patient's  family history includes Stroke in his maternal grandmother.   ROS:   Please see the history of present illness.    ROS All other systems reviewed and are negative.   PHYSICAL EXAM:   VS:  BP 122/84   Pulse 74   Ht 6\' 7"  (2.007 m)   Wt 291 lb (132 kg)   SpO2 98%   BMI 32.78 kg/m   Physical Exam  GEN: Obese, in no acute distress  Neck: no JVD, carotid bruits, or masses Cardiac:RRR; no murmurs, rubs, or gallops  Respiratory:  clear to auscultation bilaterally, normal work of breathing GI: soft, nontender, nondistended, + BS Ext: without cyanosis, clubbing, or edema, Good distal pulses bilaterally Neuro:  Alert and Oriented x 3, Psych: euthymic mood, full affect  Wt Readings from Last 3 Encounters:  03/28/21 291 lb (  132 kg)  12/07/20 297 lb (134.7 kg)  11/13/20 297 lb (134.7 kg)      Studies/Labs Reviewed:   EKG:  EKG is not ordered today.     Recent Labs: 09/13/2020: TSH 3.541 09/14/2020: ALT 51; BUN 12; Creatinine, Ser 1.00; Hemoglobin 15.2; Magnesium 2.2; Platelets 201; Potassium 3.4; Sodium 139   Lipid Panel No results found for: CHOL, TRIG, HDL, CHOLHDL, VLDL, LDLCALC, LDLDIRECT  Additional studies/ records that were reviewed today include:  2D echo 08/2020 IMPRESSIONS    1. Left ventricular ejection fraction, by estimation, is 65 to 70%. The  left ventricle has normal function. The left ventricle has no regional  wall motion abnormalities. There is mild left ventricular hypertrophy.  Left ventricular diastolic parameters  were normal. The average left ventricular  global longitudinal strain is  -22.6 %.   2. Right ventricular systolic function is normal. The right ventricular  size is normal.   3. The mitral valve is normal in structure. Trivial mitral valve  regurgitation. No evidence of mitral stenosis.   4. The aortic valve is normal in structure. Aortic valve regurgitation is  not visualized. No aortic stenosis is present.    Chest CTA 08/2020 IMPRESSION: 1. No evidence of thoracic aortic aneurysm or dissection. 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 Suggest cardiology and or vascular surgery follow-up. Could consider echocardiography as warranted. 2. Cardiomegaly 3. No sign of central or lobar embolism. Lung base assessment limited by motion artifact. 4. Mild cardiomegaly. 5. Incidental imaging of upper abdominal contents with signs of hepatic steatosis.       Risk Assessment/Calculations:         ASSESSMENT:    1. Essential hypertension   2. Thoracic aortic aneurysm without rupture (HCC)   3. Syncope, unspecified syncope type      PLAN:  In order of problems listed above:  Hypertension-BP well controlled on amlodipine and valsartan. He's lost 6 lbs, is exercising daily and watching salt closely. Asking if he needs to stay on these meds. BP is perfect and no dizziness or side effects. Continue current dose. If he loses more weight or BP drops consider decreasing amlodipine to 5 mg daily.   Dilated thoracic aorta 4 cm on CT referred to CVTS given his young age and felt to be normal for his size  Syncope etiology unclear 2D echo normal except for dilated thoracic aorta, event monitor no arrhythmia-no recurrent syncope  Obesity-exercise and weight loss   Shared Decision Making/Informed Consent        Medication Adjustments/Labs and Tests Ordered: Current medicines are reviewed at length with the patient today.  Concerns regarding medicines are outlined  above.  Medication changes, Labs and Tests ordered today are listed in the Patient Instructions below. There are no Patient Instructions on file for this visit.   Elson Clan, PA-C  03/28/2021 7:50 AM    [facility] [address] [place], [place], [place]  [address] Phone: [phone]; Fax: [phone]

## 2021-03-28 ENCOUNTER — Ambulatory Visit (INDEPENDENT_AMBULATORY_CARE_PROVIDER_SITE_OTHER): Payer: Self-pay | Admitting: Physician Assistant

## 2021-03-28 ENCOUNTER — Other Ambulatory Visit: Payer: Self-pay

## 2021-03-28 ENCOUNTER — Encounter: Payer: Self-pay | Admitting: Physician Assistant

## 2021-03-28 VITALS — BP 122/84 | HR 74 | Ht 79.0 in | Wt 291.0 lb

## 2021-03-28 DIAGNOSIS — I10 Essential (primary) hypertension: Secondary | ICD-10-CM

## 2021-03-28 DIAGNOSIS — E669 Obesity, unspecified: Secondary | ICD-10-CM

## 2021-03-28 DIAGNOSIS — I712 Thoracic aortic aneurysm, without rupture, unspecified: Secondary | ICD-10-CM

## 2021-03-28 DIAGNOSIS — R55 Syncope and collapse: Secondary | ICD-10-CM

## 2021-03-28 NOTE — Patient Instructions (Addendum)
Medication Instructions:  Your physician recommends that you continue on your current medications as directed. Please refer to the Current Medication list given to you today.  *If you need a refill on your cardiac medications before your next appointment, please call your pharmacy*   Lab Work: None ordered   If you have labs (blood work) drawn today and your tests are completely normal, you will receive your results only by: MyChart Message (if you have MyChart) OR A paper copy in the mail If you have any lab test that is abnormal or we need to change your treatment, we will call you to review the results.   Testing/Procedures: None ordered    Follow-Up: Follow up as scheduled    Other Instructions Your physician recommends that you get 150 minutes of exercise a week

## 2021-04-03 ENCOUNTER — Telehealth: Payer: Self-pay | Admitting: Pharmacist

## 2021-04-03 NOTE — Telephone Encounter (Signed)
Patient's father called stating that patient has been taking 20mg  of amlodipine instead of 10mg . I advised that he should decrease back to 10mg  daily. There shouldn't be any harmful effects from taking 20mg  but he should decrease to 10mg  daily.

## 2021-05-14 ENCOUNTER — Other Ambulatory Visit: Payer: Self-pay | Admitting: Surgery

## 2021-05-14 DIAGNOSIS — I712 Thoracic aortic aneurysm, without rupture, unspecified: Secondary | ICD-10-CM

## 2021-06-13 ENCOUNTER — Other Ambulatory Visit: Payer: Self-pay

## 2021-06-13 ENCOUNTER — Ambulatory Visit (INDEPENDENT_AMBULATORY_CARE_PROVIDER_SITE_OTHER): Payer: Self-pay | Admitting: Physician Assistant

## 2021-06-13 ENCOUNTER — Ambulatory Visit
Admission: RE | Admit: 2021-06-13 | Discharge: 2021-06-13 | Disposition: A | Discharge status: Home / Self Care | Payer: Medicaid Other | Source: Ambulatory Visit | Attending: Surgery | Admitting: Surgery

## 2021-06-13 VITALS — BP 125/82 | HR 78 | Resp 18 | Ht 79.0 in

## 2021-06-13 DIAGNOSIS — I712 Thoracic aortic aneurysm, without rupture, unspecified: Secondary | ICD-10-CM

## 2021-06-13 DIAGNOSIS — I7121 Aneurysm of the ascending aorta, without rupture: Secondary | ICD-10-CM

## 2021-06-13 NOTE — Patient Instructions (Signed)
Continue to carefully monitor his blood pressure. Follow-up in 1 year with CTA chest.

## 2021-06-13 NOTE — Progress Notes (Signed)
[address]       [place] [address]             [phone]       HPI:  Mr. Helder holding insulin 30 year old male with a history of hypertension, obesity, and Asperger's syndrome.  He was incidentally found to have a 4 cm ascending aortic aneurysm chest CT in March of this year after having a CT scan of his chest as part of the evaluation following a fall. He has no family history or personal history of aneurysms.  He was last seen in our office by Dr. Vickey Sages in June of this year.  His history and physical reviewed.  He returns today for scheduled follow-up.  Current Outpatient Medications  Medication Sig Dispense Refill   amLODipine (NORVASC) 10 MG tablet Take 1 tablet (10 mg total) by mouth daily. 90 tablet 3   guaiFENesin (MUCINEX PO) Take 1 tablet by mouth as directed.     valsartan (DIOVAN) 40 MG tablet Take 1 tablet (40 mg total) by mouth daily. 90 tablet 3   No current facility-administered medications for this visit.    Physical Exam  Vital signs BP 125/90 Pulse 78 Respirations 18  General: 30 year old male no acute distress.  Accompanied by his mom today. Neck: No carotid or bruits, no JVD Heart: Regular rate and rhythm, no murmur. Chest: Breath sounds are clear to auscultation Extremities: Warm and well-perfused.  No peripheral edema.  Diagnostic Tests:  CLINICAL DATA:  Follow-up of thoracic aortic aneurysm   EXAM: CT CHEST WITHOUT CONTRAST   TECHNIQUE: Multidetector CT imaging of the chest was performed following the standard protocol without IV contrast.   COMPARISON:  09/13/2020   FINDINGS: Cardiovascular: Motion degradation, centered about the ascending aorta. Tortuous ascending segment. Maximally 4.0 cm on 57/2 within the mid ascending segment, similar to on the prior exam (when remeasured). Based on coronal reformats, maximally 3.8 cm today versus similar to on the prior exam (when remeasured).   Normal caliber at the  level of the sinuses and sinotubular junction. Tortuous descending thoracic aorta. No periaortic hemorrhage or other acute complication.   Mild cardiomegaly.   Mediastinum/Nodes: No mediastinal or definite hilar adenopathy, given limitations of unenhanced CT.   Lungs/Pleura: No pleural fluid.  Clear lungs.   Upper Abdomen: Normal imaged portions of the liver, spleen, stomach, pancreas, gallbladder, adrenal glands, kidneys.   Musculoskeletal: Mild bilateral gynecomastia. No acute osseous abnormality.   IMPRESSION: 1. Similar borderline to mild ascending aortic aneurysm, on the order of 4.0 cm. Recommend annual imaging followup by CTA or MRA. This recommendation follows 2010 ACCF/AHA/AATS/ACR/ASA/SCA/SCAI/SIR/STS/SVM Guidelines for the Diagnosis and Management of Patients with Thoracic Aortic Disease. Circulation. 2010; 121: G387-F643. Aortic aneurysm NOS (ICD10-I71.9) 2. Motion degradation within the upper chest. 3. Mild cardiomegaly. 4. Mild bilateral gynecomastia.     Electronically Signed   By: Jeronimo Greaves M.D.   On: 06/13/2021 14:22   Impression / Plan: Stable ascending aortic dilation initially identified on CT scan done earlier this year performed.  He understands the importance of close medical follow-up in strict management of blood pressure.  He was last seen by Wynell Balloon, PA-C in September of this year and has follow-up with Dr. Armanda Magic in March of next year. He is currently only taking his amlodipine 10 mg p.o. daily.  His mom says they are unable to afford the valsartan.  His blood pressure appears to be well controlled.  We will  plan for repeat CT -A chest and follow-up with [place] in 1 year.    Leary Roca, PA-C [facility] [phone]

## 2021-09-14 ENCOUNTER — Encounter: Payer: Self-pay | Admitting: Cardiology

## 2021-09-14 ENCOUNTER — Ambulatory Visit (INDEPENDENT_AMBULATORY_CARE_PROVIDER_SITE_OTHER): Payer: BLUE CROSS/BLUE SHIELD | Admitting: Cardiology

## 2021-09-14 ENCOUNTER — Other Ambulatory Visit: Payer: Self-pay

## 2021-09-14 VITALS — BP 120/82 | HR 58 | Ht 79.0 in | Wt 306.0 lb

## 2021-09-14 DIAGNOSIS — I10 Essential (primary) hypertension: Secondary | ICD-10-CM

## 2021-09-14 DIAGNOSIS — R55 Syncope and collapse: Secondary | ICD-10-CM | POA: Diagnosis not present

## 2021-09-14 DIAGNOSIS — I77810 Thoracic aortic ectasia: Secondary | ICD-10-CM | POA: Diagnosis not present

## 2021-09-14 NOTE — Patient Instructions (Signed)

## 2021-09-14 NOTE — Progress Notes (Signed)
?Cardiology Office Note:   ? ?Date:  09/14/2021  ? ?ID:  Juan Hendrix, DOB [birth year], MRN [id number] ? ?PCP:  Pcp, No  ?Cardiologist:  Armanda Magic, MD   ? ?Referring MD: No ref. provider found  ? ?Chief Complaint  ?Patient presents with  ? Follow-up  ?  Syncope, HTN and dilated aorta.   ? ? ?History of Present Illness:   ? ?Juan Hendrix is a 31 y.o. male with a hx of obesity and Asperger's syndrome. He  history of syncope.  He  was up at 2am working on [employer] and then found himself on the bathroom floor and did not remember how he got there.  His mom heard a noise but was in bed and ignored it but then heard him calling out.  She found him on the floor with his body shaking.  He had no incontinence or tongue biting and denied any sx of chest pain or pressure, SOB, palpitations or dizziness.  His mom checked his BS which was normal.  The patient was very weak and unable to get up for 45 minutes.  He was brought to the ER where workup showed SCr was 0.99, Hbg 15.1.  hsTrop was mildly elevated with flat trend at 169>167.   ? ?Workup in the hospital included a 2D echo that showed normal LVF with mild LVH and it was felt that he had undiagnosed HTN.  Chest CTA showed dilated thoracic aorta at 4cm.  A sleep study was recommended outpt which he has not had done yet.  Event monitor showed no arrhythmias.   ? ?He is here today for followup and is doing well.  He denies any chest pain or pressure, SOB, DOE, PND, orthopnea, LE edema, dizziness, palpitations or syncope. He is compliant with his meds and is tolerating meds with no SE.    ?Past Medical History:  ?Diagnosis Date  ? Asperger syndrome   ? ? ?No past surgical history on file. ? ?Current Medications: ?Current Meds  ?Medication Sig  ? amLODipine (NORVASC) 10 MG tablet Take 1 tablet (10 mg total) by mouth daily.  ? guaiFENesin (MUCINEX PO) Take 1 tablet by mouth as directed.  ?  ? ?Allergies:   Patient has no known allergies.  ? ?Social History   ? ?Socioeconomic History  ? Marital status: Single  ?  Spouse name: Not on file  ? Number of children: Not on file  ? Years of education: Not on file  ? Highest education level: Not on file  ?Occupational History  ? Not on file  ?Tobacco Use  ? Smoking status: Never  ? Smokeless tobacco: Never  ?Vaping Use  ? Vaping Use: Never used  ?Substance and Sexual Activity  ? Alcohol use: Never  ? Drug use: Never  ? Sexual activity: Not on file  ?Other Topics Concern  ? Not on file  ?Social History Narrative  ? Not on file  ? ?Social Determinants of Health  ? ?Financial Resource Strain: Not on file  ?Food Insecurity: Not on file  ?Transportation Needs: Not on file  ?Physical Activity: Not on file  ?Stress: Not on file  ?Social Connections: Not on file  ?  ? ?Family History: ?The patient's family history includes Stroke in his maternal grandmother. ? ?ROS:   ?Please see the history of present illness.    ?ROS  ?All other systems reviewed and negative.  ? ?EKGs/Labs/Other Studies Reviewed:   ? ?The following studies were reviewed today: ?2D  echo 08/2020 ?IMPRESSIONS  ? ? 1. Left ventricular ejection fraction, by estimation, is 65 to 70%. The  ?left ventricle has normal function. The left ventricle has no regional  ?wall motion abnormalities. There is mild left ventricular hypertrophy.  ?Left ventricular diastolic parameters  ?were normal. The average left ventricular global longitudinal strain is  ?-22.6 %.  ? 2. Right ventricular systolic function is normal. The right ventricular  ?size is normal.  ? 3. The mitral valve is normal in structure. Trivial mitral valve  ?regurgitation. No evidence of mitral stenosis.  ? 4. The aortic valve is normal in structure. Aortic valve regurgitation is  ?not visualized. No aortic stenosis is present.  ? ?Chest CTA 08/2020 ?IMPRESSION: ?1. No evidence of thoracic aortic aneurysm or dissection. Dilation ?of the thoracic aorta to 4 cm compatible with aortic aneurysm, ?unusual in a patient of  this age. Suggest cardiology and or ?vascular surgery follow-up. Could consider echocardiography as ?warranted. ?2. Cardiomegaly ?3. No sign of central or lobar embolism. Lung base assessment ?limited by motion artifact. ?4. Mild cardiomegaly. ?5. Incidental imaging of upper abdominal contents with signs of ?hepatic steatosis. ? ? ?EKG:  EKG is ordered today and demonstrates sinus bradycardia with LVH by voltage with LAFB ? ?Recent Labs: ?No results found for requested labs within last 8760 hours.  ? ?Recent Lipid Panel ?No results found for: CHOL, TRIG, HDL, CHOLHDL, VLDL, LDLCALC, LDLDIRECT ? ? ?Physical Exam:   ? ?VS:  BP 120/82   Pulse (!) 58   Ht 6\' 7"  (2.007 m)   Wt (!) 306 lb (138.8 kg)   SpO2 98%   BMI 34.47 kg/m?    ? ?Wt Readings from Last 3 Encounters:  ?09/14/21 (!) 306 lb (138.8 kg)  ?03/28/21 291 lb (132 kg)  ?12/07/20 297 lb (134.7 kg)  ?  ?GEN: Well nourished, well developed in no acute distress ?HEENT: Normal ?NECK: No JVD; No carotid bruits ?LYMPHATICS: No lymphadenopathy ?CARDIAC:RRR, no murmurs, rubs, gallops ?RESPIRATORY:  Clear to auscultation without rales, wheezing or rhonchi  ?ABDOMEN: Soft, non-tender, non-distended ?MUSCULOSKELETAL:  No edema; No deformity  ?SKIN: Warm and dry ?NEUROLOGIC:  Alert and oriented x 3 ?PSYCHIATRIC:  Normal affect   ?ASSESSMENT:   ? ?1. Syncope, unspecified syncope type   ?2. Ascending aorta dilatation (HCC)   ?3. Primary hypertension   ? ?PLAN:   ? ?In order of problems listed above: ? ?Syncope ?-etiology unclear ?-2D echo normal except for dilated thoracic aorta ?-event monitor with no arrhythmias ?-He denies any further dizzy spells and has not had any further syncope ?-if he has further syncopal spells will need to refer to EP for ILR ? ?Dilated thoracic aorta ?-noted to be 4cm by CT>>this may be normal for his height and weight ?-He is now followed by CVTS surgery given his young age ?-BP is controlled ? ?HTN ?-BP is controlled on exam today ?-Continue  prescription drug management with amlodipine 10 mg daily with PRN refills ? ?Followup with me in 1 year ? ?Medication Adjustments/Labs and Tests Ordered: ?Current medicines are reviewed at length with the patient today.  Concerns regarding medicines are outlined above.  ?Orders Placed This Encounter  ?Procedures  ? EKG 12-Lead  ? ?No orders of the defined types were placed in this encounter. ? ? ?Signed, ?02/06/21, MD  ?09/14/2021 9:56 AM    ?[facility] ?

## 2021-12-07 ENCOUNTER — Other Ambulatory Visit: Payer: Self-pay | Admitting: Cardiology

## 2022-05-22 ENCOUNTER — Other Ambulatory Visit: Payer: Self-pay | Admitting: Surgery

## 2022-05-22 DIAGNOSIS — I7121 Aneurysm of the ascending aorta, without rupture: Secondary | ICD-10-CM

## 2022-05-31 DIAGNOSIS — Z419 Encounter for procedure for purposes other than remedying health state, unspecified: Secondary | ICD-10-CM | POA: Diagnosis not present

## 2022-06-18 ENCOUNTER — Inpatient Hospital Stay: Admission: RE | Admit: 2022-06-18 | Payer: BLUE CROSS/BLUE SHIELD | Source: Ambulatory Visit

## 2022-06-18 ENCOUNTER — Encounter: Payer: Self-pay | Admitting: Surgery

## 2022-06-18 ENCOUNTER — Ambulatory Visit: Payer: BLUE CROSS/BLUE SHIELD

## 2022-07-01 DIAGNOSIS — Z419 Encounter for procedure for purposes other than remedying health state, unspecified: Secondary | ICD-10-CM | POA: Diagnosis not present

## 2022-08-01 DIAGNOSIS — Z419 Encounter for procedure for purposes other than remedying health state, unspecified: Secondary | ICD-10-CM | POA: Diagnosis not present

## 2022-08-05 NOTE — Progress Notes (Unsigned)
[address]       [place] [address]             [phone]    PCP is Pcp, No Referring Provider is Sueanne Margarita, MD  Chief Complaint: Ascending thoracic aortic aneurysm   HPI: This is a 32 year old male with a past medical history of hypertension, obesity, and Asperger Syndrome who was incidentally (after a fall) found to have a 4.0 cm ascending thoracic aortic aneurysm and presents for further surveillance. He denies chest pain, pressure, or  tightness,  or LE edema. He states he did have an episode of shortness of breath a few weeks ago, but thinks it was anxiety.   Past Medical History:  Diagnosis Date   Asperger syndrome    Surgery History: No past surgical history on file.  Family History  Problem Relation Age of Onset   Stroke Maternal Grandmother    Family History: Diabetes in mother and maternal grandmother. Alzheimer's in maternal grandmother  Social History Social History   Tobacco Use   Smoking status: Never   Smokeless tobacco: Never  Vaping Use   Vaping Use: Never used  Substance Use Topics   Alcohol use: Never   Drug use: Never    Current Outpatient Medications  Medication Sig Dispense Refill   amLODipine (NORVASC) 10 MG tablet TAKE 1 TABLET(10 MG) BY MOUTH DAILY 90 tablet 3   guaiFENesin (MUCINEX PO) Take 1 tablet by mouth as directed.     valsartan (DIOVAN) 40 MG tablet Take 1 tablet (40 mg total) by mouth daily. (Patient not taking: Reported on 09/14/2021) 90 tablet 3   Allergies No Known Allergies  Review of Systems  Chest Pain [ N ]  Pedal Edema [N  ]  General Review of Systems: [Y] = yes [ N]=no  Consitutional:   nausea [ N];  fever [ N];  Resp: cough [ N];  hemoptysis[N ];  GI: vomiting[ N]; melena[ N]; hematochezia [N];  BZ:JIRCVELFY[B ]; Heme/Lymph: anemia[ N];  Neuro: TIA[ N];stroke[N ];  seizures[N ];  Endocrine: diabetes[N ];   Vital Signs: Vitals:   08/06/22 1326  BP: 138/84  Pulse: 73  Resp:  18  SpO2: 100%     Physical Exam: CV-RRR, no murmur Pulmonary-Clear to auscultation bilaterally Neck-No carotid bruit Abdomen-Soft, obese, non tender, bowel sounds present Extremities-No LE edema Neurologic-Grossly intact without focal deficit   Diagnostic Tests:  EXAM: CT ANGIOGRAPHY CHEST WITH CONTRAST   TECHNIQUE: Multidetector CT imaging of the chest was performed using the standard protocol during bolus administration of intravenous contrast. Multiplanar CT image reconstructions and MIPs were obtained to evaluate the vascular anatomy.   RADIATION DOSE REDUCTION: This exam was performed according to the departmental dose-optimization program which includes automated exposure control, adjustment of the mA and/or kV according to patient size and/or use of iterative reconstruction technique.   CONTRAST:  70mL ISOVUE-370 IOPAMIDOL (ISOVUE-370) INJECTION 76%   COMPARISON:  Chest CTs from 2022   FINDINGS: Cardiovascular: The heart is normal in size. No pericardial effusion. Mild fusiform dilatation of the ascending thoracic aorta with maximum measurement of 3.8 cm. No dissection or atherosclerotic calcifications. The branch vessels are patent. No coronary artery calcifications.   Mediastinum/Nodes: No mediastinal or hilar mass or lymphadenopathy. Stable residual patchy thymic tissue noted in the anterior mediastinum. The esophagus is normal.   Lungs/Pleura: No acute or significant pulmonary findings. The central tracheobronchial tree is unremarkable.   Upper Abdomen: No significant  upper abdominal findings.   Musculoskeletal: No significant bony findings.   Review of the MIP images confirms the above findings.   IMPRESSION: 1. Mild stable fusiform dilatation of the ascending thoracic aorta with maximum measurement of 3.8 cm. 2. No aortic dissection or atherosclerotic calcifications. 3. No acute pulmonary findings.     Electronically Signed   By: Marijo Sanes M.D.   On: 08/06/2022 12:28   Impression and Plan: CTA of the chest showed 3.8 cm fusiform dilatation of the ascending aortic aneurysm.  Echocardiogram shows an aortic valve normal in structure without evidence of regurgitation.  We discussed the natural history and and risk factors for growth of ascending aortic aneurysms.  We covered the importance of smoking cessation, tight blood pressure control, refraining from lifting heavy objects, and avoiding fluoroquinolones.  The patient is aware of signs and symptoms of aortic dissection and when to present to the emergency department.  We will continue surveillance and a repeat CTA was ordered for 12 months.    Nani Skillern, PA-C [facility] [phone]

## 2022-08-06 ENCOUNTER — Ambulatory Visit: Payer: BLUE CROSS/BLUE SHIELD | Admitting: Physician Assistant

## 2022-08-06 ENCOUNTER — Telehealth: Payer: Self-pay

## 2022-08-06 ENCOUNTER — Encounter: Payer: Self-pay | Admitting: Physician Assistant

## 2022-08-06 ENCOUNTER — Ambulatory Visit
Admission: RE | Admit: 2022-08-06 | Discharge: 2022-08-06 | Disposition: A | Discharge status: Home / Self Care | Payer: BLUE CROSS/BLUE SHIELD | Source: Ambulatory Visit | Attending: Surgery | Admitting: Surgery

## 2022-08-06 VITALS — BP 138/84 | HR 73 | Resp 18 | Ht 79.0 in | Wt 316.0 lb

## 2022-08-06 DIAGNOSIS — I7121 Aneurysm of the ascending aorta, without rupture: Secondary | ICD-10-CM

## 2022-08-06 MED ORDER — IOPAMIDOL (ISOVUE-370) INJECTION 76%
75.0000 mL | Freq: Once | INTRAVENOUS | Status: AC | PRN
  Administered 2022-08-06: 75 mL via INTRAVENOUS

## 2022-08-06 NOTE — Patient Instructions (Addendum)
Risk Modification in those with ascending thoracic aortic aneurysm:  Continue good control of blood pressure (prefer SBP 130/80 or less)-continue Amlodipine  2. Avoid fluoroquinolone antibiotics (I.e Ciprofloxacin, Avelox, Levofloxacin, Ofloxacin)  3.  Use of statin (to decrease cardiovascular risk)-no lipid profile available. Will defer to cardiology If/when to initiate  4.  Exercise and activity limitations is individualized, but in general, contact sports are to be  avoided and one should avoid heavy lifting (defined as half of ideal body weight) and exercises involving sustained Valsalva maneuver.  5. Counseling for those suspected of having genetically mediated disease. First-degree relatives of those with TAA disease should be screened as well as those who have a connective tissue disease (I.e with Marfan syndrome, Ehlers-Danlos syndrome,  and Loeys-Dietz syndrome) or a  bicuspid aortic valve,have an increased risk for  complications related to TAA. Echo done in March 2022 showed aortic valve to be normal in structure, no AS or AI, trivial MR, and LVEF 65-70%.  6.He does not have a history of tobacco abuse

## 2022-08-06 NOTE — Telephone Encounter (Signed)
Mychart msg sent. AS, CMA 

## 2022-08-30 DIAGNOSIS — Z419 Encounter for procedure for purposes other than remedying health state, unspecified: Secondary | ICD-10-CM | POA: Diagnosis not present

## 2022-09-30 DIAGNOSIS — Z419 Encounter for procedure for purposes other than remedying health state, unspecified: Secondary | ICD-10-CM | POA: Diagnosis not present

## 2022-10-30 DIAGNOSIS — Z419 Encounter for procedure for purposes other than remedying health state, unspecified: Secondary | ICD-10-CM | POA: Diagnosis not present

## 2022-11-06 IMAGING — CT CT HEAD W/O CM
3 series · 15 of 47 positions shown, 18 images · non-contrast
Comparison: None.

CLINICAL DATA: Syncopal episode, status post fall

EXAM:
CT HEAD WITHOUT CONTRAST
TECHNIQUE: Contiguous axial images were obtained from the base of the skull
through the vertex without intravenous contrast.

[Series 2: head wo · axial · 0.47mm/px · z∈[-157,-17]mm · 9 of 34 slices shown, 12 images]
[im 3/34  brain]
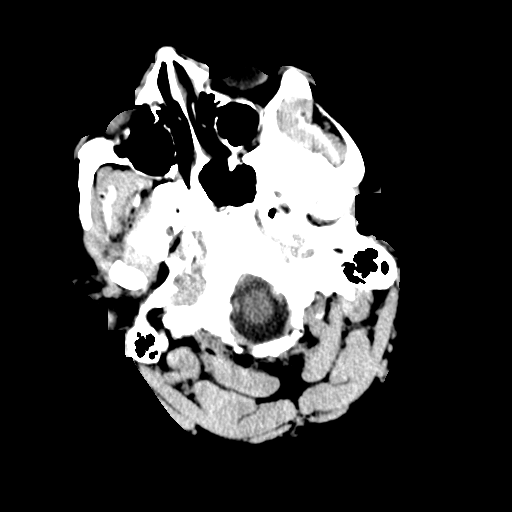
[im 3/34  bone]
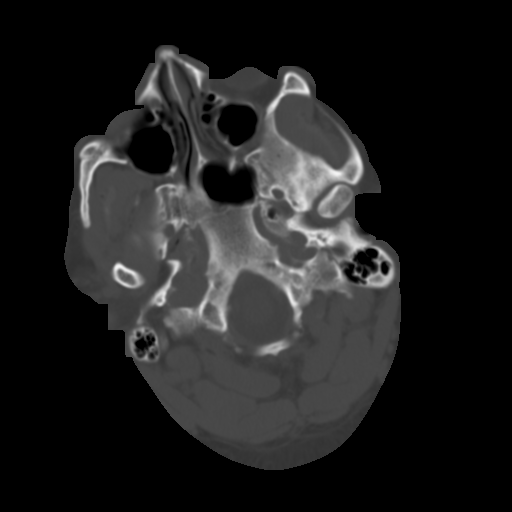
[im 6/34  brain]
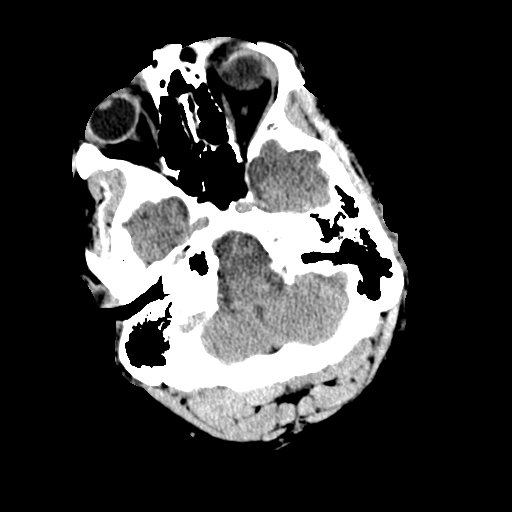
[im 10/34  brain]
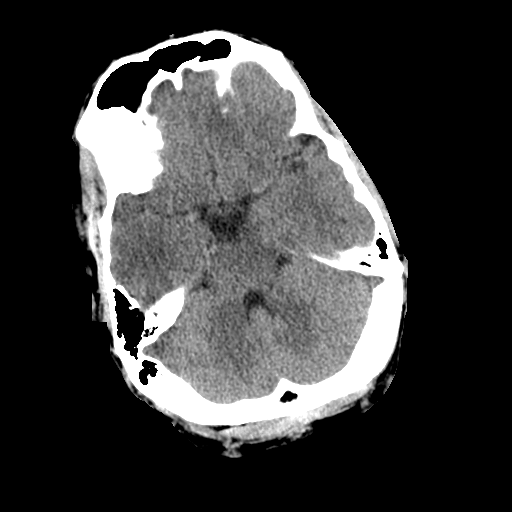
[im 13/34  brain]
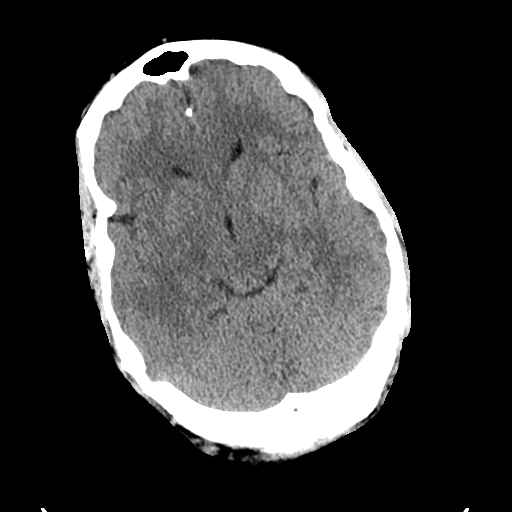
[im 18/34  brain]
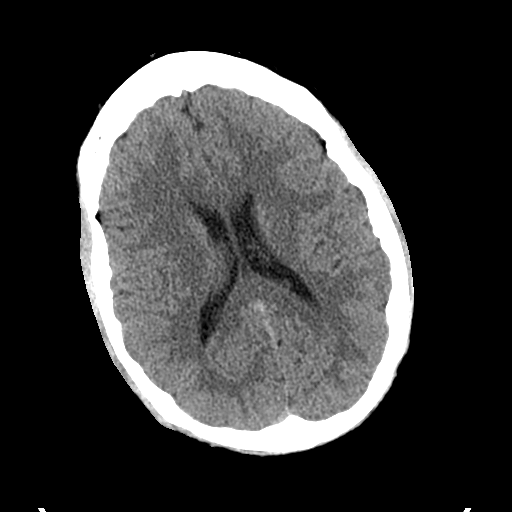
[im 18/34  bone]
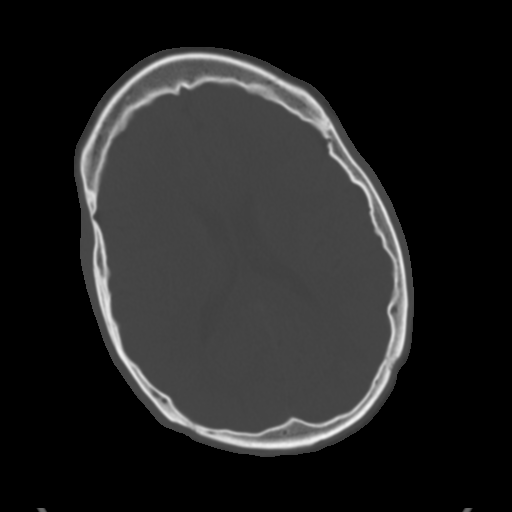
[im 21/34  brain]
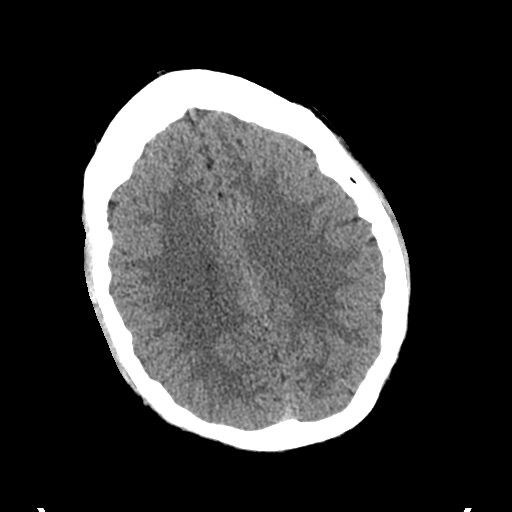
[im 24/34  brain]
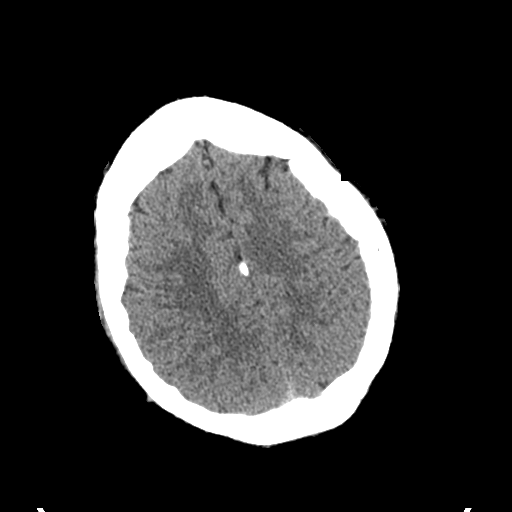
[im 28/34  brain]
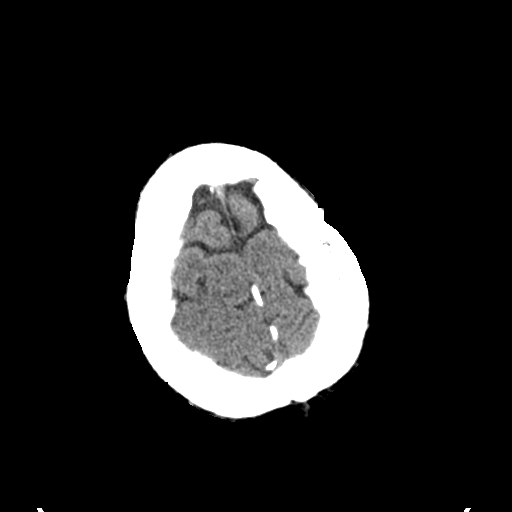
[im 31/34  brain]
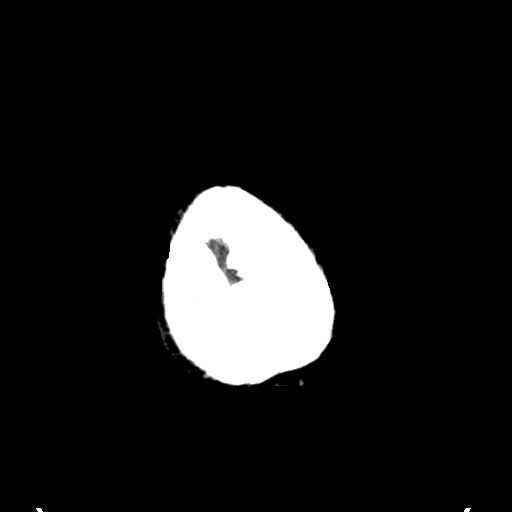
[im 31/34  bone]
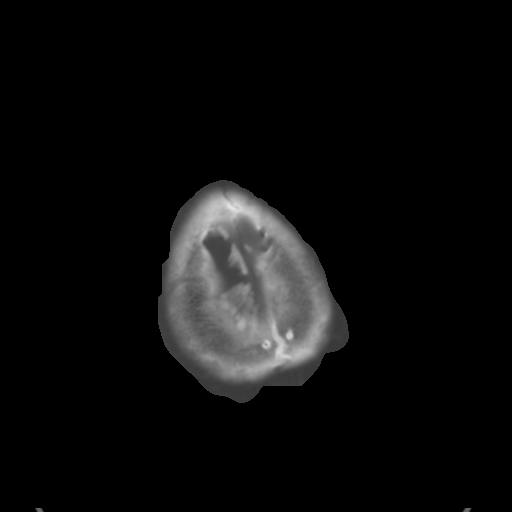

[Series 4: coronal soft tissue · coronal · 0.32mm/px · 3 of 72 slices shown]
[im 24/72  brain]
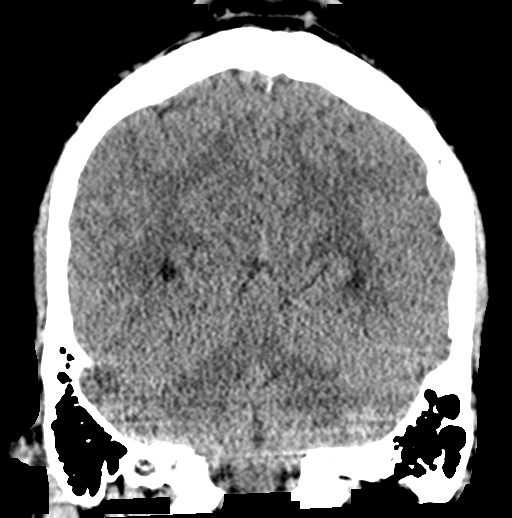
[im 32/72  brain]
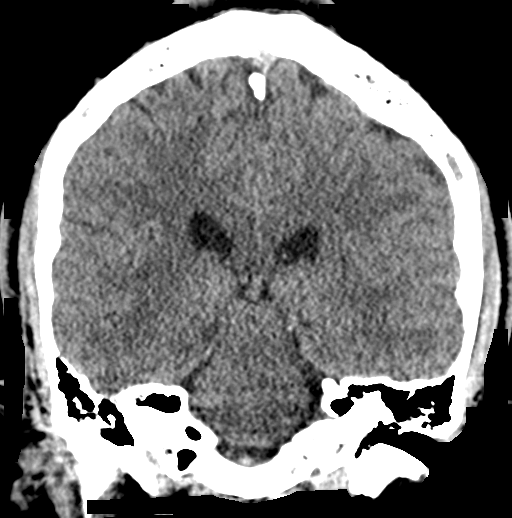
[im 40/72  brain]
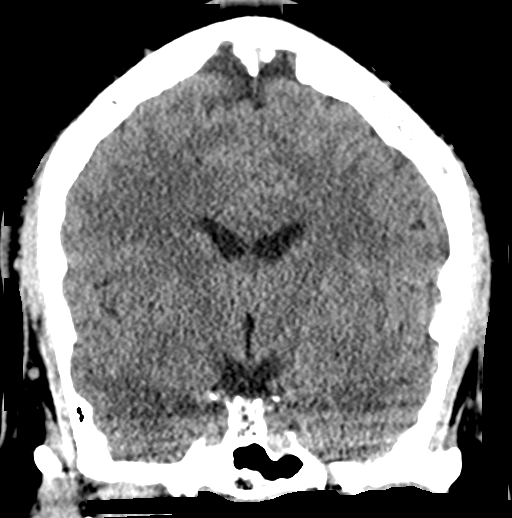

[Series 5: sagittal soft tissue · sagittal · 0.33mm/px · 3 of 54 slices shown]
[im 18/54  brain]
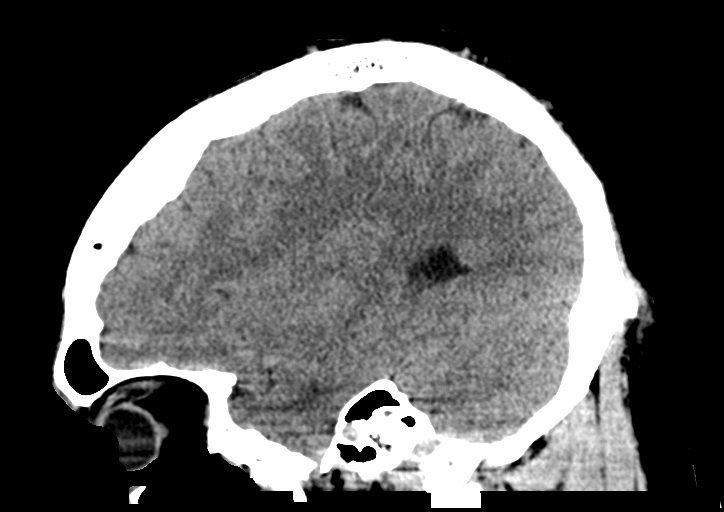
[im 27/54  brain]
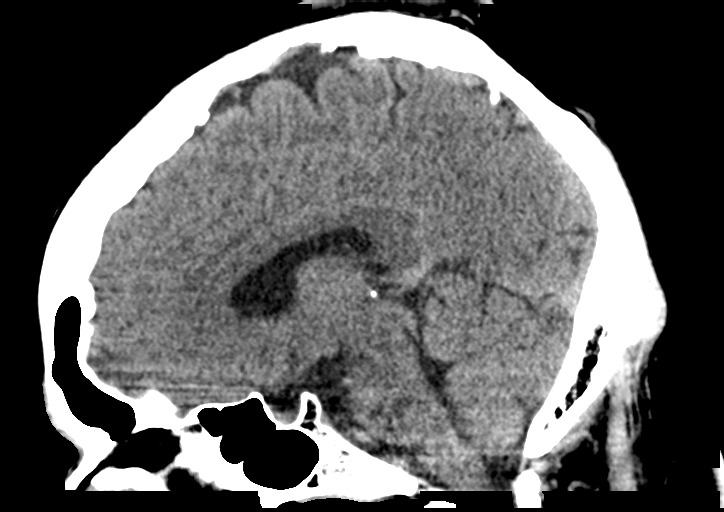
[im 36/54  brain]
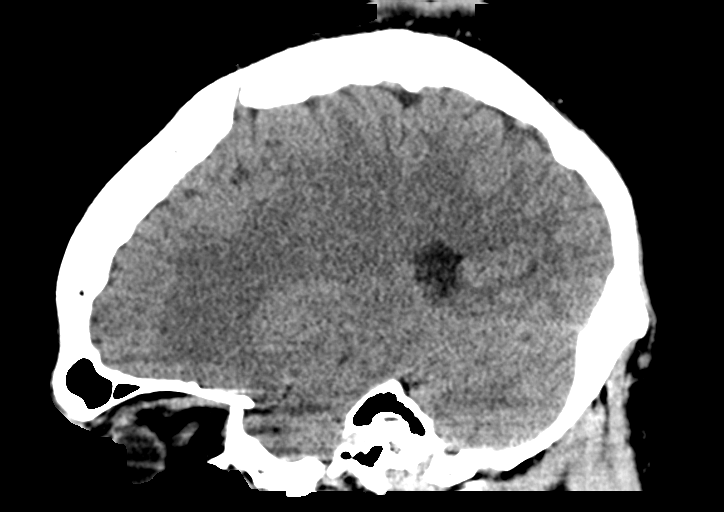

[15 of 47 positions shown; findings below may reference images not displayed]

FINDINGS: Brain:

No evidence of large-territorial acute infarction. No parenchymal
hemorrhage. No mass lesion. No extra-axial collection.

No mass effect or midline shift. No hydrocephalus. Basilar cisterns
are patent.

Vascular: No hyperdense vessel.

Skull: No acute fracture or focal lesion.

Sinuses/Orbits: Paranasal sinuses and mastoid air cells are clear.
The orbits are unremarkable.

Other: Trace subcutaneus soft tissue edema of the right posterior
scalp.
IMPRESSION: No acute intracranial abnormality.

## 2022-11-30 DIAGNOSIS — Z419 Encounter for procedure for purposes other than remedying health state, unspecified: Secondary | ICD-10-CM | POA: Diagnosis not present

## 2022-12-30 DIAGNOSIS — Z419 Encounter for procedure for purposes other than remedying health state, unspecified: Secondary | ICD-10-CM | POA: Diagnosis not present

## 2023-01-30 DIAGNOSIS — Z419 Encounter for procedure for purposes other than remedying health state, unspecified: Secondary | ICD-10-CM | POA: Diagnosis not present

## 2023-03-02 DIAGNOSIS — Z419 Encounter for procedure for purposes other than remedying health state, unspecified: Secondary | ICD-10-CM | POA: Diagnosis not present

## 2023-05-02 DIAGNOSIS — Z419 Encounter for procedure for purposes other than remedying health state, unspecified: Secondary | ICD-10-CM | POA: Diagnosis not present

## 2023-06-01 DIAGNOSIS — Z419 Encounter for procedure for purposes other than remedying health state, unspecified: Secondary | ICD-10-CM | POA: Diagnosis not present

## 2023-07-02 DIAGNOSIS — Z419 Encounter for procedure for purposes other than remedying health state, unspecified: Secondary | ICD-10-CM | POA: Diagnosis not present

## 2023-07-09 ENCOUNTER — Other Ambulatory Visit: Payer: Self-pay | Admitting: Surgery

## 2023-07-09 DIAGNOSIS — I7121 Aneurysm of the ascending aorta, without rupture: Secondary | ICD-10-CM

## 2023-07-16 ENCOUNTER — Other Ambulatory Visit: Payer: Self-pay | Admitting: Surgery

## 2023-07-16 DIAGNOSIS — I7121 Aneurysm of the ascending aorta, without rupture: Secondary | ICD-10-CM

## 2023-08-02 DIAGNOSIS — Z419 Encounter for procedure for purposes other than remedying health state, unspecified: Secondary | ICD-10-CM | POA: Diagnosis not present

## 2023-08-06 ENCOUNTER — Other Ambulatory Visit: Payer: BLUE CROSS/BLUE SHIELD

## 2023-08-11 NOTE — Progress Notes (Deleted)
  HPI: [name] is a 33 year old male with a past medical history of hypertension, obesity, and Asperger Syndrome who was incidentally (after a fall) found to have a 4.0 cm ascending thoracic aortic aneurysm in 2022.  He presents scheduled surveillance. On hs CTA last year the ascending aorta measured 3.8cm.   He denies chest pain, pressure, or tightness,  or LE edema.    Current Outpatient Medications  Medication Sig Dispense Refill   amLODipine (NORVASC) 10 MG tablet TAKE 1 TABLET(10 MG) BY MOUTH DAILY 90 tablet 3   valsartan (DIOVAN) 40 MG tablet Take 1 tablet (40 mg total) by mouth daily. (Patient not taking: Reported on 08/06/2022) 90 tablet 3   No current facility-administered medications for this visit.    Physical Exam: ***  Diagnostic Tests: ***  Impression / Plan: ***   Juan Roca, PA-C [facility] [phone]

## 2023-08-13 ENCOUNTER — Ambulatory Visit: Payer: BLUE CROSS/BLUE SHIELD

## 2023-08-30 DIAGNOSIS — Z419 Encounter for procedure for purposes other than remedying health state, unspecified: Secondary | ICD-10-CM | POA: Diagnosis not present

## 2023-10-11 DIAGNOSIS — Z419 Encounter for procedure for purposes other than remedying health state, unspecified: Secondary | ICD-10-CM | POA: Diagnosis not present

## 2023-11-10 DIAGNOSIS — Z419 Encounter for procedure for purposes other than remedying health state, unspecified: Secondary | ICD-10-CM | POA: Diagnosis not present

## 2023-12-11 DIAGNOSIS — Z419 Encounter for procedure for purposes other than remedying health state, unspecified: Secondary | ICD-10-CM | POA: Diagnosis not present

## 2024-01-10 DIAGNOSIS — Z419 Encounter for procedure for purposes other than remedying health state, unspecified: Secondary | ICD-10-CM | POA: Diagnosis not present

## 2024-02-10 DIAGNOSIS — Z419 Encounter for procedure for purposes other than remedying health state, unspecified: Secondary | ICD-10-CM | POA: Diagnosis not present

## 2024-03-12 DIAGNOSIS — Z419 Encounter for procedure for purposes other than remedying health state, unspecified: Secondary | ICD-10-CM | POA: Diagnosis not present

## 2024-05-08 ENCOUNTER — Other Ambulatory Visit: Payer: Self-pay

## 2024-05-08 ENCOUNTER — Encounter (HOSPITAL_COMMUNITY): Payer: Self-pay | Admitting: *Deleted

## 2024-05-08 ENCOUNTER — Ambulatory Visit (HOSPITAL_COMMUNITY): Admission: EM | Admit: 2024-05-08 | Discharge: 2024-05-08 | Disposition: A | Discharge status: Home / Self Care

## 2024-05-08 DIAGNOSIS — H6122 Impacted cerumen, left ear: Secondary | ICD-10-CM

## 2024-05-08 MED ORDER — CARBAMIDE PEROXIDE 6.5 % OT SOLN
OTIC | Status: AC
  Filled 2024-05-08: qty 15

## 2024-05-08 MED ORDER — CARBAMIDE PEROXIDE 6.5 % OT SOLN
5.0000 [drp] | Freq: Once | OTIC | Status: AC
  Administered 2024-05-08: 5 [drp] via OTIC

## 2024-05-08 NOTE — ED Triage Notes (Signed)
 PT reports since Thursday he has had ear problem. Pt reports he feels water in his Lt ear from his shower on Thursday.

## 2024-05-08 NOTE — Discharge Instructions (Addendum)
  1. Impacted cerumen of left ear (Primary) - carbamide peroxide (DEBROX) 6.5 % OTIC (EAR) solution 5 drop to the left ear for cerumen impaction - Ear wax removal attempted in urgent care, treatment discontinued due to patient discomfort. - Continue use of Debrox drops daily at home, place 5 drops in left ear and leave in the ear for 15 to 20 minutes. - After using Debrox flush ears with warm water in the shower to remove any excess cerumen. - If you experience any escalation of your current symptoms or development of new symptoms follow-up in ER for further evaluation and treatment.

## 2024-05-08 NOTE — ED Provider Notes (Signed)
 [facility]  Note:  This document was prepared using Conservation officer, historic buildings and may include unintentional dictation errors.  MRN: [id number] DOB: Oct 31, 1990  Subjective:   Juan Hendrix is a 33 y.o. male presenting for evaluation of left ear.  Patient reports he has had the feeling/sensation of water in the left ear since Thursday.  Patient denies any severe pain, purulent drainage from ear, fever, loss of hearing.  Patient denies taking any medication at home for symptoms.  No shortness of breath, chest pain, nasal congestion, body aches, sore throat, cough, nausea/vomiting.  No current facility-administered medications for this encounter.  Current Outpatient Medications:    amLODipine  (NORVASC ) 10 MG tablet, TAKE 1 TABLET(10 MG) BY MOUTH DAILY, Disp: 90 tablet, Rfl: 3   valsartan  (DIOVAN ) 40 MG tablet, Take 1 tablet (40 mg total) by mouth daily. (Patient not taking: Reported on 08/06/2022), Disp: 90 tablet, Rfl: 3   No Known Allergies  Past Medical History:  Diagnosis Date   Asperger syndrome      History reviewed. No pertinent surgical history.  Family History  Problem Relation Age of Onset   Stroke Maternal Grandmother     Social History   Tobacco Use   Smoking status: Never   Smokeless tobacco: Never  Vaping Use   Vaping status: Never Used  Substance Use Topics   Alcohol use: Never   Drug use: Never    ROS Refer to HPI for ROS details.  Objective:    Vitals: BP (!) 146/96   Temp 98.2 F (36.8 C)   Resp 20   SpO2 94%   Physical Exam Vitals and nursing note reviewed.  Constitutional:      General: He is not in acute distress.    Appearance: He is well-developed. He is not ill-appearing or toxic-appearing.  HENT:     Head: Normocephalic.     Right Ear: Tympanic membrane, ear canal and external ear normal. There is no impacted cerumen. No foreign body.     Left Ear: External ear normal. Tenderness present. No drainage or  swelling. There is impacted cerumen. No foreign body.     Nose: Nose normal.     Mouth/Throat:     Mouth: Mucous membranes are moist.     Pharynx: Oropharynx is clear.  Cardiovascular:     Rate and Rhythm: Normal rate.  Pulmonary:     Effort: Pulmonary effort is normal. No respiratory distress.  Skin:    General: Skin is warm and dry.  Neurological:     General: No focal deficit present.     Mental Status: He is alert and oriented to person, place, and time.  Psychiatric:        Mood and Affect: Mood normal.        Behavior: Behavior normal.     Procedures  No results found for this or any previous visit (from the past 24 hours).  Assessment and Plan :     Discharge Instructions       1. Impacted cerumen of left ear (Primary) - carbamide peroxide (DEBROX) 6.5 % OTIC (EAR) solution 5 drop to the left ear for cerumen impaction - Ear wax removal attempted in urgent care, treatment discontinued due to patient discomfort. - Continue use of Debrox drops daily at home, place 5 drops in left ear and leave in the ear for 15 to 20 minutes. - After using Debrox flush ears with warm water in the shower to remove any excess cerumen. -  If you experience any escalation of your current symptoms or development of new symptoms follow-up in ER for further evaluation and treatment.      Juan Hendrix   Mirren Gest, [place] B, NP 05/08/24 1120
